# Patient Record
Sex: Female | Born: 1950
Health system: Southern US, Community
[De-identification: ages and names within clinical notes are randomized; demographics above are authoritative.]

## PROBLEM LIST (undated history)

## (undated) DIAGNOSIS — F32A Depression, unspecified: Secondary | ICD-10-CM

## (undated) DIAGNOSIS — F419 Anxiety disorder, unspecified: Secondary | ICD-10-CM

## (undated) DIAGNOSIS — H8103 Meniere's disease, bilateral: Secondary | ICD-10-CM

## (undated) DIAGNOSIS — K222 Esophageal obstruction: Secondary | ICD-10-CM

## (undated) DIAGNOSIS — E538 Deficiency of other specified B group vitamins: Secondary | ICD-10-CM

## (undated) DIAGNOSIS — G4700 Insomnia, unspecified: Secondary | ICD-10-CM

## (undated) DIAGNOSIS — D649 Anemia, unspecified: Secondary | ICD-10-CM

## (undated) DIAGNOSIS — I10 Essential (primary) hypertension: Secondary | ICD-10-CM

## (undated) DIAGNOSIS — K589 Irritable bowel syndrome without diarrhea: Secondary | ICD-10-CM

## (undated) DIAGNOSIS — F329 Major depressive disorder, single episode, unspecified: Secondary | ICD-10-CM

## (undated) DIAGNOSIS — K760 Fatty (change of) liver, not elsewhere classified: Secondary | ICD-10-CM

## (undated) DIAGNOSIS — Z01419 Encounter for gynecological examination (general) (routine) without abnormal findings: Secondary | ICD-10-CM

## (undated) DIAGNOSIS — K219 Gastro-esophageal reflux disease without esophagitis: Secondary | ICD-10-CM

## (undated) DIAGNOSIS — M542 Cervicalgia: Secondary | ICD-10-CM

## (undated) HISTORY — DX: Fatty (change of) liver, not elsewhere classified: K76.0

## (undated) HISTORY — PX: TONSILLECTOMY: SUR1361

## (undated) HISTORY — DX: Irritable bowel syndrome, unspecified: K58.9

## (undated) HISTORY — DX: Encounter for gynecological examination (general) (routine) without abnormal findings: Z01.419

## (undated) HISTORY — DX: Esophageal obstruction: K22.2

## (undated) HISTORY — DX: Meniere's disease, bilateral: H81.03

## (undated) HISTORY — DX: Cervicalgia: M54.2

## (undated) HISTORY — DX: Gastro-esophageal reflux disease without esophagitis: K21.9

## (undated) HISTORY — DX: Anemia, unspecified: D64.9

## (undated) HISTORY — DX: Insomnia, unspecified: G47.00

## (undated) HISTORY — DX: Deficiency of other specified B group vitamins: E53.8

## (undated) HISTORY — DX: Major depressive disorder, single episode, unspecified: F32.9

## (undated) HISTORY — DX: Anxiety disorder, unspecified: F41.9

## (undated) HISTORY — DX: Depression, unspecified: F32.A

## (undated) HISTORY — PX: TUBAL LIGATION: SHX77

## (undated) HISTORY — DX: Essential (primary) hypertension: I10

## (undated) HISTORY — PX: APPENDECTOMY: SHX54

---

## 1998-03-11 ENCOUNTER — Other Ambulatory Visit: Admission: RE | Admit: 1998-03-11 | Discharge: 1998-03-11 | Payer: Self-pay | Admitting: Obstetrics & Gynecology

## 1999-03-31 ENCOUNTER — Other Ambulatory Visit: Admission: RE | Admit: 1999-03-31 | Discharge: 1999-03-31 | Payer: Self-pay | Admitting: Obstetrics & Gynecology

## 2000-04-05 ENCOUNTER — Other Ambulatory Visit: Admission: RE | Admit: 2000-04-05 | Discharge: 2000-04-05 | Payer: Self-pay | Admitting: Obstetrics & Gynecology

## 2000-08-22 ENCOUNTER — Encounter: Admission: RE | Admit: 2000-08-22 | Discharge: 2000-08-22 | Payer: Self-pay | Admitting: Obstetrics & Gynecology

## 2001-10-26 ENCOUNTER — Other Ambulatory Visit: Admission: RE | Admit: 2001-10-26 | Discharge: 2001-10-26 | Payer: Self-pay | Admitting: Obstetrics & Gynecology

## 2002-11-04 ENCOUNTER — Other Ambulatory Visit: Admission: RE | Admit: 2002-11-04 | Discharge: 2002-11-04 | Payer: Self-pay | Admitting: Obstetrics & Gynecology

## 2003-11-10 ENCOUNTER — Encounter: Payer: Self-pay | Admitting: Gastroenterology

## 2003-11-17 ENCOUNTER — Other Ambulatory Visit: Admission: RE | Admit: 2003-11-17 | Discharge: 2003-11-17 | Payer: Self-pay | Admitting: Obstetrics & Gynecology

## 2004-12-21 ENCOUNTER — Other Ambulatory Visit: Admission: RE | Admit: 2004-12-21 | Discharge: 2004-12-21 | Payer: Self-pay | Admitting: Obstetrics & Gynecology

## 2004-12-28 ENCOUNTER — Ambulatory Visit: Payer: Self-pay | Admitting: Family Medicine

## 2005-06-14 ENCOUNTER — Ambulatory Visit: Payer: Self-pay | Admitting: Family Medicine

## 2005-12-26 ENCOUNTER — Other Ambulatory Visit: Admission: RE | Admit: 2005-12-26 | Discharge: 2005-12-26 | Payer: Self-pay | Admitting: Obstetrics & Gynecology

## 2006-01-02 ENCOUNTER — Ambulatory Visit: Payer: Self-pay | Admitting: Family Medicine

## 2006-04-27 ENCOUNTER — Ambulatory Visit: Payer: Self-pay | Admitting: Family Medicine

## 2006-11-06 ENCOUNTER — Ambulatory Visit: Payer: Self-pay | Admitting: Family Medicine

## 2007-01-09 ENCOUNTER — Ambulatory Visit: Payer: Self-pay | Admitting: Family Medicine

## 2007-01-09 LAB — CONVERTED CEMR LAB
ALT: 41 units/L — ABNORMAL HIGH (ref 0–40)
AST: 34 units/L (ref 0–37)
Bilirubin, Direct: 0.1 mg/dL (ref 0.0–0.3)
Cholesterol: 152 mg/dL (ref 0–200)
GFR calc Af Amer: 112 mL/min
HDL: 36.6 mg/dL — ABNORMAL LOW (ref >39.0)
Lymphocytes Relative: 35.2 % (ref 12.0–46.0)
MCHC: 34.8 g/dL (ref 30.0–36.0)
Neutrophils Relative %: 54.4 % (ref 43.0–77.0)
Potassium: 3.9 meq/L (ref 3.5–5.1)
RBC: 4.72 M/uL (ref 3.87–5.11)
TSH: 3.05 microintl units/mL (ref 0.35–5.50)
Total Bilirubin: 0.7 mg/dL (ref 0.3–1.2)
Total CHOL/HDL Ratio: 4.2
Total Protein: 6.7 g/dL (ref 6.0–8.3)
Triglycerides: 167 mg/dL — ABNORMAL HIGH (ref 0–149)
VLDL: 33 mg/dL (ref 0–40)

## 2007-01-17 ENCOUNTER — Ambulatory Visit: Payer: Self-pay | Admitting: Family Medicine

## 2007-06-12 ENCOUNTER — Encounter: Payer: Self-pay | Admitting: Family Medicine

## 2007-06-28 DIAGNOSIS — F411 Generalized anxiety disorder: Secondary | ICD-10-CM | POA: Insufficient documentation

## 2007-06-28 DIAGNOSIS — I10 Essential (primary) hypertension: Secondary | ICD-10-CM

## 2007-12-03 ENCOUNTER — Ambulatory Visit: Payer: Self-pay | Admitting: Family Medicine

## 2007-12-03 DIAGNOSIS — M546 Pain in thoracic spine: Secondary | ICD-10-CM | POA: Insufficient documentation

## 2008-01-16 ENCOUNTER — Telehealth: Payer: Self-pay | Admitting: Family Medicine

## 2008-02-27 ENCOUNTER — Ambulatory Visit: Payer: Self-pay | Admitting: Family Medicine

## 2008-02-27 LAB — CONVERTED CEMR LAB
Bilirubin Urine: NEGATIVE
Blood in Urine, dipstick: NEGATIVE
Nitrite: NEGATIVE
Specific Gravity, Urine: 1.02
Urobilinogen, UA: 0.2
pH: 6

## 2008-03-03 LAB — CONVERTED CEMR LAB
ALT: 27 units/L (ref 0–35)
Basophils Absolute: 0 10*3/uL (ref 0.0–0.1)
Bilirubin, Direct: 0.1 mg/dL (ref 0.0–0.3)
Cholesterol: 171 mg/dL (ref 0–200)
Creatinine, Ser: 0.8 mg/dL (ref 0.4–1.2)
Eosinophils Absolute: 0.2 10*3/uL (ref 0.0–0.7)
Glucose, Bld: 95 mg/dL (ref 70–99)
HCT: 37.7 % (ref 36.0–46.0)
HDL: 32.5 mg/dL — ABNORMAL LOW (ref >39.0)
LDL Cholesterol: 110 mg/dL — ABNORMAL HIGH (ref 0–99)
Lymphocytes Relative: 33.7 % (ref 12.0–46.0)
Monocytes Relative: 7.2 % (ref 3.0–12.0)
Neutrophils Relative %: 56.1 % (ref 43.0–77.0)
Potassium: 4 meq/L (ref 3.5–5.1)
RBC: 4.83 M/uL (ref 3.87–5.11)
RDW: 13.5 % (ref 11.5–14.6)
Sodium: 143 meq/L (ref 135–145)
TSH: 2.04 microintl units/mL (ref 0.35–5.50)
Total CHOL/HDL Ratio: 5.3
Triglycerides: 143 mg/dL (ref 0–149)
VLDL: 29 mg/dL (ref 0–40)
WBC: 6.8 10*3/uL (ref 4.5–10.5)

## 2008-03-05 ENCOUNTER — Ambulatory Visit: Payer: Self-pay | Admitting: Family Medicine

## 2008-03-05 DIAGNOSIS — F418 Other specified anxiety disorders: Secondary | ICD-10-CM

## 2008-04-11 ENCOUNTER — Ambulatory Visit: Payer: Self-pay | Admitting: Family Medicine

## 2008-04-18 ENCOUNTER — Telehealth: Payer: Self-pay | Admitting: Family Medicine

## 2008-06-13 ENCOUNTER — Encounter: Payer: Self-pay | Admitting: Family Medicine

## 2008-12-05 ENCOUNTER — Ambulatory Visit: Payer: Self-pay | Admitting: Family Medicine

## 2008-12-05 DIAGNOSIS — K219 Gastro-esophageal reflux disease without esophagitis: Secondary | ICD-10-CM

## 2008-12-29 ENCOUNTER — Encounter: Payer: Self-pay | Admitting: Gastroenterology

## 2009-01-13 ENCOUNTER — Telehealth: Payer: Self-pay | Admitting: Gastroenterology

## 2009-02-09 ENCOUNTER — Telehealth: Payer: Self-pay | Admitting: Family Medicine

## 2009-03-03 ENCOUNTER — Ambulatory Visit: Payer: Self-pay | Admitting: Family Medicine

## 2009-03-03 LAB — CONVERTED CEMR LAB
Blood in Urine, dipstick: NEGATIVE
Glucose, Urine, Semiquant: NEGATIVE
Ketones, urine, test strip: NEGATIVE
Specific Gravity, Urine: 1.025
Urobilinogen, UA: 0.2

## 2009-03-04 LAB — CONVERTED CEMR LAB
Alkaline Phosphatase: 73 units/L (ref 39–117)
CO2: 31 meq/L (ref 19–32)
Cholesterol: 154 mg/dL (ref 0–200)
Creatinine, Ser: 0.7 mg/dL (ref 0.4–1.2)
Eosinophils Absolute: 0.2 10*3/uL (ref 0.0–0.7)
GFR calc non Af Amer: 91.5 mL/min (ref >60)
Glucose, Bld: 94 mg/dL (ref 70–99)
Hemoglobin: 11.6 g/dL — ABNORMAL LOW (ref 12.0–15.0)
MCHC: 33.8 g/dL (ref 30.0–36.0)
MCV: 74.9 fL — ABNORMAL LOW (ref 78.0–100.0)
Neutro Abs: 3.9 10*3/uL (ref 1.4–7.7)
Platelets: 216 10*3/uL (ref 150.0–400.0)
Potassium: 4 meq/L (ref 3.5–5.1)
RBC: 4.58 M/uL (ref 3.87–5.11)
Sodium: 142 meq/L (ref 135–145)
TSH: 1.72 microintl units/mL (ref 0.35–5.50)
Total CHOL/HDL Ratio: 4
Total Protein: 6.4 g/dL (ref 6.0–8.3)
Triglycerides: 110 mg/dL (ref 0.0–149.0)
VLDL: 22 mg/dL (ref 0.0–40.0)
WBC: 6.4 10*3/uL (ref 4.5–10.5)

## 2009-03-09 ENCOUNTER — Ambulatory Visit: Payer: Self-pay | Admitting: Family Medicine

## 2009-03-19 ENCOUNTER — Ambulatory Visit: Payer: Self-pay | Admitting: Gastroenterology

## 2009-03-19 DIAGNOSIS — K5900 Constipation, unspecified: Secondary | ICD-10-CM

## 2009-03-19 DIAGNOSIS — R209 Unspecified disturbances of skin sensation: Secondary | ICD-10-CM

## 2009-03-19 LAB — CONVERTED CEMR LAB: Tissue Transglutaminase Ab, IgA: 0.1 units (ref <7)

## 2009-03-20 LAB — CONVERTED CEMR LAB
ALT: 18 units/L (ref 0–35)
AST: 26 units/L (ref 0–37)
Alkaline Phosphatase: 72 units/L (ref 39–117)
Calcium: 9.1 mg/dL (ref 8.4–10.5)
Creatinine, Ser: 0.6 mg/dL (ref 0.4–1.2)
Folate: 18.3 ng/mL
GFR calc non Af Amer: 109.3 mL/min (ref >60)
Saturation Ratios: 8 % — ABNORMAL LOW (ref 20.0–50.0)
Sodium: 141 meq/L (ref 135–145)
TSH: 1.69 microintl units/mL (ref 0.35–5.50)
Total Protein: 6.7 g/dL (ref 6.0–8.3)
Vitamin B-12: 726 pg/mL (ref 211–911)

## 2009-03-30 ENCOUNTER — Ambulatory Visit (HOSPITAL_COMMUNITY): Admission: RE | Admit: 2009-03-30 | Discharge: 2009-03-30 | Payer: Self-pay | Admitting: Gastroenterology

## 2009-04-02 ENCOUNTER — Telehealth: Payer: Self-pay | Admitting: Gastroenterology

## 2009-04-29 ENCOUNTER — Ambulatory Visit: Payer: Self-pay | Admitting: Gastroenterology

## 2009-04-29 ENCOUNTER — Encounter: Payer: Self-pay | Admitting: Gastroenterology

## 2009-04-29 HISTORY — PX: ESOPHAGOGASTRODUODENOSCOPY: SHX1529

## 2009-04-29 HISTORY — PX: COLONOSCOPY: SHX174

## 2009-04-29 LAB — CONVERTED CEMR LAB: UREASE: NEGATIVE

## 2009-04-30 ENCOUNTER — Ambulatory Visit: Payer: Self-pay | Admitting: Gastroenterology

## 2009-05-01 ENCOUNTER — Encounter: Payer: Self-pay | Admitting: Gastroenterology

## 2009-05-19 DIAGNOSIS — K7689 Other specified diseases of liver: Secondary | ICD-10-CM

## 2009-05-19 DIAGNOSIS — K589 Irritable bowel syndrome without diarrhea: Secondary | ICD-10-CM

## 2009-05-19 DIAGNOSIS — D509 Iron deficiency anemia, unspecified: Secondary | ICD-10-CM | POA: Insufficient documentation

## 2009-05-19 DIAGNOSIS — K222 Esophageal obstruction: Secondary | ICD-10-CM

## 2009-05-28 ENCOUNTER — Ambulatory Visit: Payer: Self-pay | Admitting: Gastroenterology

## 2009-06-17 ENCOUNTER — Encounter: Payer: Self-pay | Admitting: Family Medicine

## 2009-08-19 ENCOUNTER — Ambulatory Visit: Payer: Self-pay | Admitting: Family Medicine

## 2009-08-19 DIAGNOSIS — R42 Dizziness and giddiness: Secondary | ICD-10-CM | POA: Insufficient documentation

## 2009-08-19 DIAGNOSIS — H9319 Tinnitus, unspecified ear: Secondary | ICD-10-CM | POA: Insufficient documentation

## 2009-08-21 ENCOUNTER — Telehealth: Payer: Self-pay | Admitting: Gastroenterology

## 2009-08-21 ENCOUNTER — Encounter: Admission: RE | Admit: 2009-08-21 | Discharge: 2009-08-21 | Payer: Self-pay | Admitting: Family Medicine

## 2009-08-31 ENCOUNTER — Telehealth: Payer: Self-pay | Admitting: Family Medicine

## 2009-12-03 ENCOUNTER — Ambulatory Visit: Payer: Self-pay | Admitting: Family Medicine

## 2010-05-17 ENCOUNTER — Telehealth: Payer: Self-pay | Admitting: Family Medicine

## 2010-05-19 ENCOUNTER — Ambulatory Visit: Payer: Self-pay | Admitting: Family Medicine

## 2010-05-24 LAB — CONVERTED CEMR LAB
Albumin: 4.1 g/dL (ref 3.5–5.2)
Alkaline Phosphatase: 74 units/L (ref 39–117)
BUN: 20 mg/dL (ref 6–23)
Basophils Absolute: 0 10*3/uL (ref 0.0–0.1)
Bilirubin Urine: NEGATIVE
Calcium: 9.2 mg/dL (ref 8.4–10.5)
Cholesterol: 192 mg/dL (ref 0–200)
Eosinophils Absolute: 0.2 10*3/uL (ref 0.0–0.7)
Eosinophils Relative: 2 % (ref 0.0–5.0)
GFR calc non Af Amer: 92.64 mL/min (ref >60)
Hemoglobin, Urine: NEGATIVE
LDL Cholesterol: 120 mg/dL — ABNORMAL HIGH (ref 0–99)
MCHC: 34.8 g/dL (ref 30.0–36.0)
Monocytes Absolute: 0.5 10*3/uL (ref 0.1–1.0)
Neutrophils Relative %: 58 % (ref 43.0–77.0)
Platelets: 217 10*3/uL (ref 150.0–400.0)
RBC: 4.76 M/uL (ref 3.87–5.11)
Sodium: 134 meq/L — ABNORMAL LOW (ref 135–145)
Specific Gravity, Urine: 1.025 (ref 1.000–1.030)
Total Bilirubin: 0.8 mg/dL (ref 0.3–1.2)
Total Protein, Urine: NEGATIVE mg/dL
Total Protein: 7.1 g/dL (ref 6.0–8.3)
Triglycerides: 136 mg/dL (ref 0.0–149.0)
VLDL: 27.2 mg/dL (ref 0.0–40.0)
WBC: 7.8 10*3/uL (ref 4.5–10.5)
pH: 5.5 (ref 5.0–8.0)

## 2010-05-26 ENCOUNTER — Ambulatory Visit: Payer: Self-pay | Admitting: Family Medicine

## 2010-06-22 ENCOUNTER — Encounter: Payer: Self-pay | Admitting: Family Medicine

## 2010-11-23 NOTE — Assessment & Plan Note (Signed)
Summary: CPX // RS/pt rescd//ccm   Vital Signs:  Patient profile:   60 year old female Menstrual status:  postmenopausal Weight:      196 pounds BMI:     29.69 BP sitting:   112 / 70  (left arm) Cuff size:   regular  Vitals Entered By: Raechel Ache, RN (May 26, 2010 9:09 AM) CC: CPX, labs done. Sees gyn.   History of Present Illness: 60 yr old female for a cpx. She feels fine and has no concerns. when we spoke last winter she was [place] lot of stress, and she tried Celexa. She took this for 2 weeks and then stopped. Now her work situation is much less stressful, and she doesn't feel she needs to take anything for this.   Allergies: 1)  ! Pcn  Past History:  Past Medical History: Reviewed history from 03/05/2008 and no changes required. Anxiety Hypertension sees Dr. Arlyce Dice for gyn exams insomnia neck pain Depression  Past Surgical History: Reviewed history from 03/09/2009 and no changes required. Appendectomy Tubal ligation Tonsillectomy Colonoscopy 11-10-03 per Dr. Jarold Motto, repeat in 10  yrs  Family History: Reviewed history from 05/28/2009 and no changes required. Family History of CAD Female 1st degree relative <50-Mother Family History Diabetes 1st degree relative- Mother & Siblings Family History Hypertension Family History of Irritable Bowel Syndrome:Mother No FH of Colon Cancer:  Social History: Reviewed history from 03/19/2009 and no changes required. Occupation:Adjuster Married Never Smoked Alcohol use-yes -2 weekly Daily Caffeine Use-1  Review of Systems  The patient denies anorexia, fever, weight loss, weight gain, vision loss, decreased hearing, hoarseness, chest pain, syncope, dyspnea on exertion, peripheral edema, prolonged cough, headaches, hemoptysis, abdominal pain, melena, hematochezia, severe indigestion/heartburn, hematuria, incontinence, genital sores, muscle weakness, suspicious skin lesions, transient blindness, difficulty walking,  depression, unusual weight change, abnormal bleeding, enlarged lymph nodes, angioedema, breast masses, and testicular masses.    Physical Exam  General:  overweight-appearing.   Head:  Normocephalic and atraumatic without obvious abnormalities. No apparent alopecia or balding. Eyes:  No corneal or conjunctival inflammation noted. EOMI. Perrla. Funduscopic exam benign, without hemorrhages, exudates or papilledema. Vision grossly normal. Ears:  External ear exam shows no significant lesions or deformities.  Otoscopic examination reveals clear canals, tympanic membranes are intact bilaterally without bulging, retraction, inflammation or discharge. Hearing is grossly normal bilaterally. Nose:  External nasal examination shows no deformity or inflammation. Nasal mucosa are pink and moist without lesions or exudates. Mouth:  Oral mucosa and oropharynx without lesions or exudates.  Teeth in good repair. Neck:  No deformities, masses, or tenderness noted. Lungs:  Normal respiratory effort, chest expands symmetrically. Lungs are clear to auscultation, no crackles or wheezes. Heart:  Normal rate and regular rhythm. S1 and S2 normal without gallop, murmur, click, rub or other extra sounds. EKG normal Abdomen:  Bowel sounds positive,abdomen soft and non-tender without masses, organomegaly or hernias noted. Msk:  No deformity or scoliosis noted of thoracic or lumbar spine.   Pulses:  R and L carotid,radial,femoral,dorsalis pedis and posterior tibial pulses are full and equal bilaterally Extremities:  No clubbing, cyanosis, edema, or deformity noted with normal full range of motion of all joints.   Neurologic:  No cranial nerve deficits noted. Station and gait are normal. Plantar reflexes are down-going bilaterally. DTRs are symmetrical throughout. Sensory, motor and coordinative functions appear intact. Skin:  Intact without suspicious lesions or rashes Cervical Nodes:  No lymphadenopathy noted Axillary  Nodes:  No palpable lymphadenopathy Inguinal Nodes:  No significant adenopathy Psych:  Cognition and judgment appear intact. Alert and cooperative with normal attention span and concentration. No apparent delusions, illusions, hallucinations   Impression & Recommendations:  Problem # 1:  WELL ADULT EXAM (ICD-V70.0)  Orders: EKG w/ Interpretation (93000)  Complete Medication List: 1)  Ambien 10 Mg Tabs (Zolpidem tartrate) .... At bedtime as needed 2)  Benicar Hct 20-12.5 Mg Tabs (Olmesartan medoxomil-hctz) .Marland Kitchen.. 1 by mouth once daily 3)  Alprazolam 0.5 Mg Tb24 (Alprazolam) .... Three times a day as needed 4)  Aspirin 81 Mg Tbec (Aspirin) .... One by mouth every day 5)  Fish Oil Oil (Fish oil) .Marland Kitchen.. 1 by mouth once daily 6)  Calcium Carbonate-vitamin D 600-400 Mg-unit Tabs (Calcium carbonate-vitamin d) .Marland Kitchen.. 1 by mouth once daily 7)  Vitamin E 600 Unit Caps (Vitamin e) .Marland Kitchen.. 1 by mouth once daily 8)  Meclizine Hcl 25 Mg Tabs (Meclizine hcl) .Marland Kitchen.. 1 q 4 hours as needed dizziness 9)  Omeprazole 40 Mg Cpdr (Omeprazole) .... Once daily  Patient Instructions: 1)  Please schedule a follow-up appointment in 1 year.  2)  It is important that you exercise reguarly at least 20 minutes 5 times a week. If you develop chest pain, have severe difficulty breathing, or feel very tired, stop exercising immediately and seek medical attention.  3)  You need to lose weight. Consider a lower calorie diet and regular exercise.  Prescriptions: OMEPRAZOLE 40 MG CPDR (OMEPRAZOLE) once daily  #90 x 3   Entered and Authorized by:   Nelwyn Salisbury MD   Signed by:   Nelwyn Salisbury MD on 05/26/2010   Method used:   Print then Give to Patient   RxID:   [id number]

## 2010-11-23 NOTE — Assessment & Plan Note (Signed)
Summary: MEDICATION CONCERNS // RS   Vital Signs:  Patient profile:   60 year old female Menstrual status:  postmenopausal Weight:      188 pounds O2 Sat:      98 % Temp:     98.2 degrees F oral Pulse rate:   105 / minute Pulse rhythm:   regular Resp:     16 per minute BP sitting:   122 / 82  Vitals Entered By: Lynann Beaver CMA (December 03, 2009 10:01 AM) CC: to discuss meds and has had body aches, fever, chills and headache x 2-3 days Pain Assessment Patient in pain? no        History of Present Illness: Here for 2 reasons. First she has been dealing with a lot of stress lately, and is taking more Xanax than she would like to. She wants to start a  daily med for this. Also 3 days ago she developed fevers, body aches, ST, and a dry cough. No NVD. On fluids and Motrin.   Current Medications (verified): 1)  Ambien 10 Mg Tabs (Zolpidem Tartrate) .... At Bedtime As Needed 2)  Benicar Hct 20-12.5 Mg  Tabs (Olmesartan Medoxomil-Hctz) .Marland Kitchen.. 1 By Mouth Once Daily 3)  Alprazolam 0.5 Mg  Tb24 (Alprazolam) .... Three Times A Day As Needed 4)  Aspirin 81 Mg  Tbec (Aspirin) .... One By Mouth Every Day 5)  Fish Oil   Oil (Fish Oil) .Marland Kitchen.. 1 By Mouth Once Daily 6)  Calcium Carbonate-Vitamin D 600-400 Mg-Unit  Tabs (Calcium Carbonate-Vitamin D) .Marland Kitchen.. 1 By Mouth Once Daily 7)  Vitamin E 600 Unit  Caps (Vitamin E) .Marland Kitchen.. 1 By Mouth Once Daily 8)  Nexium 40 Mg  Cpdr (Esomeprazole Magnesium) .Marland Kitchen.. 1 Capsule Each Day 30 Minutes Before Meal 9)  Meclizine Hcl 25 Mg Tabs (Meclizine Hcl) .Marland Kitchen.. 1 Q 4 Hours As Needed Dizziness  Allergies (verified): 1)  ! Pcn  Past History:  Past Medical History: Reviewed history from 03/05/2008 and no changes required. Anxiety Hypertension sees Dr. Arlyce Dice for gyn exams insomnia neck pain Depression  Review of Systems  The patient denies anorexia, weight loss, weight gain, vision loss, decreased hearing, hoarseness, chest pain, syncope, dyspnea on exertion,  peripheral edema, hemoptysis, abdominal pain, melena, hematochezia, severe indigestion/heartburn, hematuria, incontinence, genital sores, muscle weakness, suspicious skin lesions, transient blindness, difficulty walking, depression, unusual weight change, abnormal bleeding, enlarged lymph nodes, angioedema, breast masses, and testicular masses.    Physical Exam  General:  Well-developed,well-nourished,in no acute distress; alert,appropriate and cooperative throughout examination Head:  Normocephalic and atraumatic without obvious abnormalities. No apparent alopecia or balding. Eyes:  No corneal or conjunctival inflammation noted. EOMI. Perrla. Funduscopic exam benign, without hemorrhages, exudates or papilledema. Vision grossly normal. Ears:  External ear exam shows no significant lesions or deformities.  Otoscopic examination reveals clear canals, tympanic membranes are intact bilaterally without bulging, retraction, inflammation or discharge. Hearing is grossly normal bilaterally. Nose:  External nasal examination shows no deformity or inflammation. Nasal mucosa are pink and moist without lesions or exudates. Mouth:  Oral mucosa and oropharynx without lesions or exudates.  Teeth in good repair. Neck:  No deformities, masses, or tenderness noted. Lungs:  Normal respiratory effort, chest expands symmetrically. Lungs are clear to auscultation, no crackles or wheezes. Psych:  Cognition and judgment appear intact. Alert and cooperative with normal attention span and concentration. No apparent delusions, illusions, hallucinations   Impression & Recommendations:  Problem # 1:  INFLUENZA (ICD-487.8)  Problem # 2:  ANXIETY (ICD-300.00)  Her updated medication list for this problem includes:    Alprazolam 0.5 Mg Tb24 (Alprazolam) .Marland Kitchen... Three times a day as needed    Celexa 20 Mg Tabs (Citalopram hydrobromide) ..... Once daily  Complete Medication List: 1)  Ambien 10 Mg Tabs (Zolpidem tartrate) ....  At bedtime as needed 2)  Benicar Hct 20-12.5 Mg Tabs (Olmesartan medoxomil-hctz) .Marland Kitchen.. 1 by mouth once daily 3)  Alprazolam 0.5 Mg Tb24 (Alprazolam) .... Three times a day as needed 4)  Aspirin 81 Mg Tbec (Aspirin) .... One by mouth every day 5)  Fish Oil Oil (Fish oil) .Marland Kitchen.. 1 by mouth once daily 6)  Calcium Carbonate-vitamin D 600-400 Mg-unit Tabs (Calcium carbonate-vitamin d) .Marland Kitchen.. 1 by mouth once daily 7)  Vitamin E 600 Unit Caps (Vitamin e) .Marland Kitchen.. 1 by mouth once daily 8)  Nexium 40 Mg Cpdr (Esomeprazole magnesium) .Marland Kitchen.. 1 capsule each day 30 minutes before meal 9)  Meclizine Hcl 25 Mg Tabs (Meclizine hcl) .Marland Kitchen.. 1 q 4 hours as needed dizziness 10)  Celexa 20 Mg Tabs (Citalopram hydrobromide) .... Once daily  Patient Instructions: 1)  Please schedule a follow-up appointment as needed . Out of work todat until 12-07-09 Prescriptions: CELEXA 20 MG TABS (CITALOPRAM HYDROBROMIDE) once daily  #30 x 11   Entered and Authorized by:   Nelwyn Salisbury MD   Signed by:   Nelwyn Salisbury MD on 12/03/2009   Method used:   [facility]        [facility] [phone]* (retail)       [address]       [place], [place]  [address]       Ph: [phone]       Fax: [phone]   RxID:   [id number]

## 2010-11-23 NOTE — Progress Notes (Signed)
Summary: refill ambien and alprazolam  Phone Note Refill Request Message from:  Fax from Pharmacy on May 17, 2010 3:02 PM  Refills Requested: Medication #1:  ALPRAZOLAM 0.5 MG  TB24 three times a day as needed   Supply Requested: 3 months  Medication #2:  AMBIEN 10 MG TABS at bedtime as needed   Supply Requested: 3 months  Method Requested: Fax to Local Pharmacy Initial call taken by: Raechel Ache, RN,  May 17, 2010 3:03 PM Caller: [facility]     Appended Document: refill ambien and alprazolam

## 2010-11-25 ENCOUNTER — Other Ambulatory Visit: Payer: Self-pay

## 2010-11-25 DIAGNOSIS — F419 Anxiety disorder, unspecified: Secondary | ICD-10-CM

## 2010-11-26 MED ORDER — ALPRAZOLAM 0.5 MG PO TABS: 0.5000 mg | ORAL_TABLET | Freq: Three times a day (TID) | ORAL | Status: DC | PRN

## 2010-11-26 NOTE — Telephone Encounter (Signed)
Faxed to [facility]

## 2011-03-11 NOTE — Assessment & Plan Note (Signed)
[facility] OFFICE NOTE   NAME:Pamela Daugherty, Pamela Daugherty                        MRN:          [id number]  DATE:01/17/2007                            DOB:          May 23, 1951    This is a 60 year old woman here for a nongynecological physical  examination. In general she is doing well except for an upper  respiratory infection. For the past 3 days she has had chills, low grade  fever, stuffy head, postnasal drainage, wheezing, and a nonproductive  cough. She is drinking fluids and taking Robitussin. Otherwise she is  doing well. She continues to see Dr. Arlyce Dice for gynecology exams. She  had a normal colonoscopy in 2005. Her anxiety has been under good  control. She is sleeping well and her blood pressure has been stable.  For other details of her past medical history, family history, social  history, habits, etc, I refer you to our last physical note date January 02, 2006.   ALLERGIES:  PENICILLIN.   CURRENT MEDICATIONS:  1. Benicar/hydrochlorothiazide 20/12.5 once a day.  2. Multivitamins daily.  3. Glucosamine and chondroitin sulfate daily.  4. Aspirin 81 mg daily.  5. Ambien 10 mg at bedtime as needed (she averages 2 or 3 a month).  6. Xanax 0.5 mg as needed (she averages 2 or 3 a month).   OBJECTIVE:  Height 5 feet 9 inches, weight 206, blood pressure 132/88,  pulse 76 and regular.  IN GENERAL: She appears to be at her baseline. She is obese.  SKIN: Free of significant lesions.  EYES: Clear.  EARS: Clear.  PHARYNX: Clear.  NECK: Supple without lymphadenopathy or masses.  LUNGS: Clear.  CARDIAC: Rate and rhythm regular, without gallops, murmurs, or rubs.  Distal pulses are full.   EKG: Within normal limits.  ABDOMEN: Soft, normal bowel sounds, nontender. No masses.  EXTREMITIES: No cyanosis, clubbing, or edema.  NEUROLOGIC EXAM: Grossly intact.   She was here for fasting labs on March 18, these were all within  normal  limits.   ASSESSMENT/PLAN:  1. Complete physical exam. We talked about increasing exercise and      losing weight.  2. Hypertension, stable.  3. Anxiety, stable.  4. Insomnia, stable.  5. Bronchitis, I gave her a Z-Pak.     Tera Mater. Clent Ridges, MD  Electronically Signed    SAF/MedQ  DD: 01/17/2007  DT: 01/17/2007  Job #: [id number]

## 2011-03-24 ENCOUNTER — Other Ambulatory Visit: Payer: Self-pay | Admitting: Gastroenterology

## 2011-05-20 ENCOUNTER — Other Ambulatory Visit (INDEPENDENT_AMBULATORY_CARE_PROVIDER_SITE_OTHER)

## 2011-05-20 ENCOUNTER — Telehealth: Payer: Self-pay | Admitting: Family Medicine

## 2011-05-20 DIAGNOSIS — Z0000 Encounter for general adult medical examination without abnormal findings: Secondary | ICD-10-CM

## 2011-05-20 LAB — POCT URINALYSIS DIPSTICK
Glucose, UA: NEGATIVE
Spec Grav, UA: 1.025
Urobilinogen, UA: 0.2
pH, UA: 5.5

## 2011-05-20 LAB — LIPID PANEL
HDL: 47.1 mg/dL (ref >39.00)
LDL Cholesterol: 86 mg/dL (ref 0–99)
Total CHOL/HDL Ratio: 3
VLDL: 24 mg/dL (ref 0.0–40.0)

## 2011-05-20 LAB — CBC WITH DIFFERENTIAL/PLATELET
Basophils Relative: 0.4 % (ref 0.0–3.0)
Eosinophils Relative: 1.6 % (ref 0.0–5.0)
Hemoglobin: 13.7 g/dL (ref 12.0–15.0)
Lymphocytes Relative: 26.2 % (ref 12.0–46.0)
Monocytes Absolute: 0.5 10*3/uL (ref 0.1–1.0)
Monocytes Relative: 5.9 % (ref 3.0–12.0)
Neutrophils Relative %: 65.9 % (ref 43.0–77.0)
Platelets: 216 10*3/uL (ref 150.0–400.0)

## 2011-05-20 LAB — HEPATIC FUNCTION PANEL
Alkaline Phosphatase: 63 U/L (ref 39–117)
Total Protein: 7.2 g/dL (ref 6.0–8.3)

## 2011-05-20 LAB — TSH: TSH: 1.69 u[IU]/mL (ref 0.35–5.50)

## 2011-05-20 LAB — BASIC METABOLIC PANEL: Sodium: 141 mEq/L (ref 135–145)

## 2011-05-20 NOTE — Telephone Encounter (Signed)
Call in Cipro 500 mg bid for 7 days  

## 2011-05-20 NOTE — Telephone Encounter (Signed)
Script called in and message left for pt.

## 2011-05-20 NOTE — Telephone Encounter (Signed)
Pt was dx'd with an uti this morning and would like for Dr Clent Ridges to [facility] in [place]. Patient refused to come in. Thanks.

## 2011-05-26 ENCOUNTER — Encounter: Payer: Self-pay | Admitting: Family Medicine

## 2011-05-30 ENCOUNTER — Encounter: Payer: Self-pay | Admitting: Family Medicine

## 2011-05-30 ENCOUNTER — Ambulatory Visit (INDEPENDENT_AMBULATORY_CARE_PROVIDER_SITE_OTHER): Admitting: Family Medicine

## 2011-05-30 VITALS — BP 122/78 | HR 87 | Temp 98.3°F | Ht 67.5 in | Wt 183.0 lb

## 2011-05-30 DIAGNOSIS — F419 Anxiety disorder, unspecified: Secondary | ICD-10-CM

## 2011-05-30 DIAGNOSIS — Z0000 Encounter for general adult medical examination without abnormal findings: Secondary | ICD-10-CM

## 2011-05-30 DIAGNOSIS — F411 Generalized anxiety disorder: Secondary | ICD-10-CM

## 2011-05-30 MED ORDER — ZOLPIDEM TARTRATE 10 MG PO TABS: 10.0000 mg | ORAL_TABLET | Freq: Every evening | ORAL | Status: DC | PRN

## 2011-05-30 MED ORDER — OLMESARTAN MEDOXOMIL-HCTZ 20-12.5 MG PO TABS: 1.0000 | ORAL_TABLET | Freq: Every day | ORAL | Status: DC

## 2011-05-30 MED ORDER — ALPRAZOLAM 0.5 MG PO TABS: 0.5000 mg | ORAL_TABLET | Freq: Three times a day (TID) | ORAL | Status: AC | PRN

## 2011-05-30 NOTE — Progress Notes (Signed)
  Subjective:    Patient ID: Pamela Daugherty, female    DOB: April 26, 1951, 60 y.o.   MRN: [id number]  HPI 60 yr old female for a cpx. She feels fine with the exception of stress. Her job has changed again, and she is now faced with a choice of either moving to [place], [place] at the end of this year or being laid off. She is using Xanax several times a day, and is using Ambien at night.    Review of Systems  Constitutional: Negative.   HENT: Negative.   Eyes: Negative.   Respiratory: Negative.   Cardiovascular: Negative.   Gastrointestinal: Negative.   Genitourinary: Negative for dysuria, urgency, frequency, hematuria, flank pain, decreased urine volume, enuresis, difficulty urinating, pelvic pain and dyspareunia.  Musculoskeletal: Negative.   Skin: Negative.   Neurological: Negative.   Hematological: Negative.   Psychiatric/Behavioral: Negative.        Objective:   Physical Exam  Constitutional: She is oriented to person, place, and time. She appears well-developed and well-nourished. No distress.  HENT:  Head: Normocephalic and atraumatic.  Right Ear: External ear normal.  Left Ear: External ear normal.  Nose: Nose normal.  Mouth/Throat: Oropharynx is clear and moist. No oropharyngeal exudate.  Eyes: Conjunctivae and EOM are normal. Pupils are equal, round, and reactive to light. No scleral icterus.  Neck: Normal range of motion. Neck supple. No JVD present. No thyromegaly present.  Cardiovascular: Normal rate, regular rhythm, normal heart sounds and intact distal pulses.  Exam reveals no gallop and no friction rub.   No murmur heard.      EKG normal   Pulmonary/Chest: Effort normal and breath sounds normal. No respiratory distress. She has no wheezes. She has no rales. She exhibits no tenderness.  Abdominal: Soft. Bowel sounds are normal. She exhibits no distension and no mass. There is no tenderness. There is no rebound and no guarding.  Musculoskeletal: Normal range of motion.  She exhibits no edema and no tenderness.  Lymphadenopathy:    She has no cervical adenopathy.  Neurological: She is alert and oriented to person, place, and time. She has normal reflexes. No cranial nerve deficit. She exhibits normal muscle tone. Coordination normal.  Skin: Skin is warm and dry. No rash noted. No erythema.  Psychiatric: She has a normal mood and affect. Her behavior is normal. Judgment and thought content normal.          Assessment & Plan:  She will try Celexa 20 mg a day again and will call me back in 2 weeks.

## 2011-06-23 ENCOUNTER — Telehealth: Payer: Self-pay | Admitting: *Deleted

## 2011-06-23 NOTE — Telephone Encounter (Signed)
Celexa is not working and wants Lexapro.  [facility].

## 2011-06-24 NOTE — Telephone Encounter (Signed)
Call in Lexapro 20 mg a day, #30 with 2 rf . See me in one month

## 2011-06-28 MED ORDER — ESCITALOPRAM OXALATE 20 MG PO TABS: 20.0000 mg | ORAL_TABLET | Freq: Every day | ORAL | Status: DC

## 2011-06-28 NOTE — Telephone Encounter (Signed)
Left message for patient and rx sent

## 2011-07-07 ENCOUNTER — Encounter: Payer: Self-pay | Admitting: Family Medicine

## 2011-07-08 ENCOUNTER — Ambulatory Visit: Admitting: Family Medicine

## 2011-07-28 ENCOUNTER — Telehealth: Payer: Self-pay | Admitting: Family Medicine

## 2011-07-28 NOTE — Telephone Encounter (Signed)
Pt called and requested a referral for ENT. She has tried to make this appointment but they will not see without the referral.

## 2011-07-29 NOTE — Telephone Encounter (Signed)
What is this referral for?  

## 2011-08-02 ENCOUNTER — Telehealth: Payer: Self-pay | Admitting: Family Medicine

## 2011-08-02 DIAGNOSIS — H9209 Otalgia, unspecified ear: Secondary | ICD-10-CM

## 2011-08-02 DIAGNOSIS — R42 Dizziness and giddiness: Secondary | ICD-10-CM

## 2011-08-02 NOTE — Telephone Encounter (Signed)
Pt has right ear pain, head feels stopped up and she is dizzy. This is the reason that she wants the ENT referral.

## 2011-08-02 NOTE — Telephone Encounter (Signed)
Referral is done. Camelia Eng will call her

## 2011-08-03 ENCOUNTER — Telehealth: Payer: Self-pay | Admitting: Family Medicine

## 2011-08-03 NOTE — Telephone Encounter (Signed)
Pt aware.

## 2011-10-05 ENCOUNTER — Ambulatory Visit (INDEPENDENT_AMBULATORY_CARE_PROVIDER_SITE_OTHER): Payer: Commercial Managed Care - PPO | Admitting: Family Medicine

## 2011-10-05 ENCOUNTER — Encounter: Payer: Self-pay | Admitting: Family Medicine

## 2011-10-05 VITALS — BP 132/84 | HR 90 | Temp 98.0°F | Wt 178.0 lb

## 2011-10-05 DIAGNOSIS — N390 Urinary tract infection, site not specified: Secondary | ICD-10-CM

## 2011-10-05 DIAGNOSIS — L723 Sebaceous cyst: Secondary | ICD-10-CM

## 2011-10-05 LAB — POCT URINALYSIS DIPSTICK
Spec Grav, UA: 1.025
Urobilinogen, UA: 0.2
pH, UA: 6

## 2011-10-05 MED ORDER — ESCITALOPRAM OXALATE 20 MG PO TABS: 20.0000 mg | ORAL_TABLET | Freq: Every day | ORAL | Status: DC

## 2011-10-05 MED ORDER — CIPROFLOXACIN HCL 500 MG PO TABS: 500.0000 mg | ORAL_TABLET | Freq: Two times a day (BID) | ORAL | Status: AC

## 2011-10-05 NOTE — Progress Notes (Signed)
Addended by: Aniceto Boss A on: 10/05/2011 01:06 PM   Modules accepted: Orders

## 2011-10-05 NOTE — Progress Notes (Signed)
  Subjective:    Patient ID: Pamela Daugherty, female    DOB: 1951-06-28, 60 y.o.   MRN: [id number]  HPI Here for 2 weeks of burning on urination and urgency. No fever or nausea.   Review of Systems  Constitutional: Negative.   Gastrointestinal: Negative.   Genitourinary: Positive for dysuria and urgency.       Objective:   Physical Exam  Constitutional: She appears well-developed and well-nourished.  Abdominal: Soft. Bowel sounds are normal. She exhibits no distension and no mass. There is no tenderness. There is no rebound and no guarding.  Skin:       nontender cyst on the right parietal scalp           Assessment & Plan:  Drink plenty of water

## 2011-10-06 ENCOUNTER — Telehealth: Payer: Self-pay | Admitting: Family Medicine

## 2011-10-06 NOTE — Telephone Encounter (Signed)
I left voice message for pt to return my call. I received a refill request for Escitalopram. Does pt want a 30 day supply or 90 day supply and where do we send the script?

## 2011-10-06 NOTE — Telephone Encounter (Signed)
Pt stated that she like  #30 x 11 [facility]. Thanks.

## 2011-10-06 NOTE — Telephone Encounter (Signed)
This has been done.

## 2011-10-24 ENCOUNTER — Telehealth: Payer: Self-pay

## 2011-10-24 MED ORDER — ESCITALOPRAM OXALATE 20 MG PO TABS: 20.0000 mg | ORAL_TABLET | Freq: Every day | ORAL | Status: DC

## 2011-10-24 NOTE — Telephone Encounter (Signed)
Per pharmacy, pt's insurance requires that lexapro be filled for #90 and not #30.  Rx changed to #90 with 2 refills.

## 2011-11-28 ENCOUNTER — Other Ambulatory Visit: Payer: Self-pay | Admitting: Obstetrics & Gynecology

## 2011-12-01 ENCOUNTER — Encounter: Payer: Self-pay | Admitting: *Deleted

## 2011-12-08 ENCOUNTER — Encounter: Payer: Self-pay | Admitting: Gastroenterology

## 2011-12-08 ENCOUNTER — Ambulatory Visit (INDEPENDENT_AMBULATORY_CARE_PROVIDER_SITE_OTHER): Payer: Commercial Managed Care - PPO | Admitting: Gastroenterology

## 2011-12-08 DIAGNOSIS — R112 Nausea with vomiting, unspecified: Secondary | ICD-10-CM

## 2011-12-08 DIAGNOSIS — K3184 Gastroparesis: Secondary | ICD-10-CM

## 2011-12-08 DIAGNOSIS — R634 Abnormal weight loss: Secondary | ICD-10-CM

## 2011-12-08 DIAGNOSIS — K5901 Slow transit constipation: Secondary | ICD-10-CM

## 2011-12-08 MED ORDER — LINACLOTIDE 145 MCG PO CAPS: 1.0000 | ORAL_CAPSULE | Freq: Every day | ORAL | Status: DC

## 2011-12-08 MED ORDER — ESOMEPRAZOLE MAGNESIUM 40 MG PO CPDR: 40.0000 mg | DELAYED_RELEASE_CAPSULE | Freq: Every day | ORAL | Status: DC

## 2011-12-08 NOTE — Patient Instructions (Addendum)
Take Nexium once a day, rx has been sent to your pharmacy. Take Linzess once a day on a empty stomach, 30 days of samples given today.  Your Gastric Empty Scan has been scheduled at [facility] 12/19/2011 1pm, please arrive at 12:45pm and stop your Nexium 12/18/2011.  <ake a follow up appt to see Dr Florentina Jenny in one month.

## 2011-12-08 NOTE — Progress Notes (Signed)
This is a 61 year old Caucasian female with early satiety and acid reflux symptoms associated with chronic functional constipation. She denies dysphagia, melena, hematochezia, or any specific hepatobiliary complaints. She's had a previous negative endoscopy, colonoscopy, an ultrasound exam. She lately has had a 20 pound weight loss over the last year. She does not abuse alcohol, cigarettes, or NSAIDs. Review of her labs and x-rays show no specific abnormalities except for mild fatty liver on ultrasound exam. Family history is noncontributory.  Current Medications, Allergies, Past Medical History, Past Surgical History, Family History and Social History were reviewed in [employer] record.  Pertinent Review of Systems Negative   Physical Exam: Cannot appreciate hepatosplenomegaly, abdominal masses or tenderness. Bowel sounds are normal. There is no succussion splash noted. Mental status is normal.    Assessment and Plan: Probable gastroparesis with secondary acid reflux and constipation. I have scheduled technetium gastric emptying scan, started Nexium 40 mg a day for GERD, and also Linzess 145 mg a day for her constipation. She is to return in one month's time for follow up. If she has evidence of a motility disorder we will consider prokinetic therapy.

## 2011-12-13 ENCOUNTER — Encounter (HOSPITAL_COMMUNITY)
Admission: RE | Admit: 2011-12-13 | Discharge: 2011-12-13 | Disposition: A | Discharge status: Home / Self Care | Payer: Commercial Managed Care - PPO | Source: Ambulatory Visit | Attending: Gastroenterology | Admitting: Gastroenterology

## 2011-12-13 DIAGNOSIS — R112 Nausea with vomiting, unspecified: Secondary | ICD-10-CM | POA: Insufficient documentation

## 2011-12-13 MED ORDER — TECHNETIUM TC 99M SULFUR COLLOID
2.0000 | Freq: Once | INTRAVENOUS | Status: AC | PRN
  Administered 2011-12-13: 2 via INTRAVENOUS

## 2011-12-14 ENCOUNTER — Other Ambulatory Visit: Payer: Self-pay | Admitting: Gastroenterology

## 2011-12-14 MED ORDER — AMBULATORY NON FORMULARY MEDICATION: Status: DC

## 2012-01-16 ENCOUNTER — Other Ambulatory Visit (HOSPITAL_COMMUNITY): Payer: Commercial Managed Care - PPO

## 2012-05-31 ENCOUNTER — Telehealth: Payer: Self-pay | Admitting: Gastroenterology

## 2012-05-31 NOTE — Telephone Encounter (Signed)
Last OV 12/08/11; hx of N/V, Gastroparesis, Slow Transit Constipation had abnormal GES, started on Domperidone and Linzess. Today pt reports last night she ate and had terrible pain under her l rib cage and her lower abdomen; she is still hurting today. After discussing her diet and Domperidone use, she admits she is not following the diet and takes the Domperidone sporadically d/t work; always takes the am dose  I offered her an appt, but she stated she will look up the diet on line and try to follow it. Explained she has to eat easy to digest foods d/t the gastroparesis and I will mail her our diet; pt stated understanding and will call for worsening.Marland Kitchen

## 2012-06-23 ENCOUNTER — Other Ambulatory Visit: Payer: Self-pay | Admitting: Gastroenterology

## 2012-06-23 ENCOUNTER — Other Ambulatory Visit: Payer: Self-pay | Admitting: Family Medicine

## 2012-06-27 ENCOUNTER — Telehealth: Payer: Self-pay

## 2012-06-27 MED ORDER — ESOMEPRAZOLE MAGNESIUM 40 MG PO CPDR: 40.0000 mg | DELAYED_RELEASE_CAPSULE | Freq: Every day | ORAL | Status: DC

## 2012-06-27 NOTE — Telephone Encounter (Signed)
Patient's insurance will only pay for a 90 day supply, so I changed amount of pills and resent the prescription to the pharmacy.

## 2012-07-11 ENCOUNTER — Encounter: Payer: Self-pay | Admitting: Family Medicine

## 2012-07-12 ENCOUNTER — Other Ambulatory Visit (INDEPENDENT_AMBULATORY_CARE_PROVIDER_SITE_OTHER): Payer: Commercial Managed Care - PPO

## 2012-07-12 DIAGNOSIS — Z0000 Encounter for general adult medical examination without abnormal findings: Secondary | ICD-10-CM

## 2012-07-12 LAB — HEPATIC FUNCTION PANEL
AST: 21 U/L (ref 0–37)
Alkaline Phosphatase: 52 U/L (ref 39–117)
Total Bilirubin: 0.7 mg/dL (ref 0.3–1.2)

## 2012-07-12 LAB — BASIC METABOLIC PANEL
BUN: 14 mg/dL (ref 6–23)
Calcium: 9.1 mg/dL (ref 8.4–10.5)
GFR: 86.18 mL/min (ref >60.00)
Glucose, Bld: 88 mg/dL (ref 70–99)
Sodium: 139 mEq/L (ref 135–145)

## 2012-07-12 LAB — CBC WITH DIFFERENTIAL/PLATELET
Basophils Absolute: 0 10*3/uL (ref 0.0–0.1)
Hemoglobin: 13.4 g/dL (ref 12.0–15.0)
Lymphocytes Relative: 39.5 % (ref 12.0–46.0)
Monocytes Relative: 6.8 % (ref 3.0–12.0)
Platelets: 199 10*3/uL (ref 150.0–400.0)
RDW: 13.4 % (ref 11.5–14.6)
WBC: 5.7 10*3/uL (ref 4.5–10.5)

## 2012-07-12 LAB — POCT URINALYSIS DIPSTICK
Bilirubin, UA: NEGATIVE
Blood, UA: NEGATIVE
Glucose, UA: NEGATIVE
Ketones, UA: NEGATIVE
Spec Grav, UA: 1.02

## 2012-07-12 LAB — LIPID PANEL
HDL: 50.1 mg/dL (ref >39.00)
LDL Cholesterol: 113 mg/dL — ABNORMAL HIGH (ref 0–99)
Total CHOL/HDL Ratio: 4
Triglycerides: 107 mg/dL (ref 0.0–149.0)

## 2012-07-13 ENCOUNTER — Telehealth: Payer: Self-pay | Admitting: Family Medicine

## 2012-07-13 NOTE — Telephone Encounter (Signed)
I left voice message with normal lab results.

## 2012-07-13 NOTE — Progress Notes (Signed)
Quick Note:  I left voice message with results. ______ 

## 2012-07-13 NOTE — Telephone Encounter (Signed)
Patient called stating that she would like a call back with lab results. Please assist.  °

## 2012-07-18 ENCOUNTER — Encounter: Payer: Self-pay | Admitting: Family Medicine

## 2012-07-18 ENCOUNTER — Other Ambulatory Visit: Payer: Commercial Managed Care - PPO

## 2012-07-18 ENCOUNTER — Ambulatory Visit (INDEPENDENT_AMBULATORY_CARE_PROVIDER_SITE_OTHER): Payer: Commercial Managed Care - PPO | Admitting: Family Medicine

## 2012-07-18 VITALS — BP 110/72 | HR 85 | Temp 98.3°F | Ht 68.0 in | Wt 164.0 lb

## 2012-07-18 DIAGNOSIS — N390 Urinary tract infection, site not specified: Secondary | ICD-10-CM

## 2012-07-18 DIAGNOSIS — Z0000 Encounter for general adult medical examination without abnormal findings: Secondary | ICD-10-CM

## 2012-07-18 LAB — POCT URINALYSIS DIPSTICK
Bilirubin, UA: NEGATIVE
Blood, UA: NEGATIVE
Ketones, UA: NEGATIVE
pH, UA: 5

## 2012-07-18 MED ORDER — OLMESARTAN MEDOXOMIL-HCTZ 20-12.5 MG PO TABS: 1.0000 | ORAL_TABLET | Freq: Every day | ORAL | Status: DC

## 2012-07-18 MED ORDER — CIPROFLOXACIN HCL 500 MG PO TABS: 500.0000 mg | ORAL_TABLET | Freq: Two times a day (BID) | ORAL | Status: DC

## 2012-07-18 MED ORDER — ESCITALOPRAM OXALATE 20 MG PO TABS: 20.0000 mg | ORAL_TABLET | Freq: Every day | ORAL | Status: DC

## 2012-07-18 NOTE — Progress Notes (Signed)
  Subjective:    Patient ID: Pamela Daugherty, female    DOB: [birth year], 61 y.o.   MRN: [id number]  HPI 61 yr old female for a cpx. She feels well except for 4 days on urinary burning and urgency. No fever or nausea. Her UA was clear when she had her cpx labs done, but these symptoms started a few days after that.    Review of Systems  Constitutional: Negative.   HENT: Negative.   Eyes: Negative.   Respiratory: Negative.   Cardiovascular: Negative.   Gastrointestinal: Negative.   Genitourinary: Positive for dysuria, urgency and frequency. Negative for hematuria, flank pain, decreased urine volume, enuresis, difficulty urinating, pelvic pain and dyspareunia.  Musculoskeletal: Negative.   Skin: Negative.   Neurological: Negative.   Hematological: Negative.   Psychiatric/Behavioral: Negative.        Objective:   Physical Exam  Constitutional: She is oriented to person, place, and time. She appears well-developed and well-nourished. No distress.  HENT:  Head: Normocephalic and atraumatic.  Right Ear: External ear normal.  Left Ear: External ear normal.  Nose: Nose normal.  Mouth/Throat: Oropharynx is clear and moist. No oropharyngeal exudate.  Eyes: Conjunctivae normal and EOM are normal. Pupils are equal, round, and reactive to light. No scleral icterus.  Neck: Normal range of motion. Neck supple. No JVD present. No thyromegaly present.  Cardiovascular: Normal rate, regular rhythm, normal heart sounds and intact distal pulses.  Exam reveals no gallop and no friction rub.   No murmur heard.      EKG normal   Pulmonary/Chest: Effort normal and breath sounds normal. No respiratory distress. She has no wheezes. She has no rales. She exhibits no tenderness.  Abdominal: Soft. Bowel sounds are normal. She exhibits no distension and no mass. There is no tenderness. There is no rebound and no guarding.  Musculoskeletal: Normal range of motion. She exhibits no edema and no tenderness.    Lymphadenopathy:    She has no cervical adenopathy.  Neurological: She is alert and oriented to person, place, and time. She has normal reflexes. No cranial nerve deficit. She exhibits normal muscle tone. Coordination normal.  Skin: Skin is warm and dry. No rash noted. No erythema.  Psychiatric: She has a normal mood and affect. Her behavior is normal. Judgment and thought content normal.          Assessment & Plan:  Well exam. Treat her current UTI and culture the sample.

## 2012-07-18 NOTE — Addendum Note (Signed)
Addended by: Aniceto Boss A on: 07/18/2012 04:37 PM   Modules accepted: Orders

## 2012-07-19 ENCOUNTER — Other Ambulatory Visit: Payer: Self-pay

## 2012-07-20 ENCOUNTER — Other Ambulatory Visit: Payer: Self-pay | Admitting: Family Medicine

## 2012-07-23 NOTE — Progress Notes (Signed)
Quick Note:  I spoke with pt ______ 

## 2012-07-25 ENCOUNTER — Encounter: Payer: Commercial Managed Care - PPO | Admitting: Family Medicine

## 2012-10-07 ENCOUNTER — Other Ambulatory Visit: Payer: Self-pay | Admitting: Gastroenterology

## 2012-10-25 ENCOUNTER — Other Ambulatory Visit: Payer: Self-pay | Admitting: Dermatology

## 2012-11-29 ENCOUNTER — Ambulatory Visit (INDEPENDENT_AMBULATORY_CARE_PROVIDER_SITE_OTHER): Admitting: Gastroenterology

## 2012-11-29 ENCOUNTER — Other Ambulatory Visit (INDEPENDENT_AMBULATORY_CARE_PROVIDER_SITE_OTHER)

## 2012-11-29 ENCOUNTER — Other Ambulatory Visit: Payer: Self-pay | Admitting: *Deleted

## 2012-11-29 ENCOUNTER — Encounter: Payer: Self-pay | Admitting: Gastroenterology

## 2012-11-29 VITALS — BP 102/60 | HR 85 | Ht 67.5 in | Wt 154.4 lb

## 2012-11-29 DIAGNOSIS — R1084 Generalized abdominal pain: Secondary | ICD-10-CM

## 2012-11-29 DIAGNOSIS — K5901 Slow transit constipation: Secondary | ICD-10-CM

## 2012-11-29 DIAGNOSIS — R634 Abnormal weight loss: Secondary | ICD-10-CM

## 2012-11-29 DIAGNOSIS — K3184 Gastroparesis: Secondary | ICD-10-CM

## 2012-11-29 DIAGNOSIS — K599 Functional intestinal disorder, unspecified: Secondary | ICD-10-CM

## 2012-11-29 LAB — COMPREHENSIVE METABOLIC PANEL
ALT: 15 U/L (ref 0–35)
AST: 20 U/L (ref 0–37)
Alkaline Phosphatase: 57 U/L (ref 39–117)
Sodium: 138 mEq/L (ref 135–145)
Total Bilirubin: 0.5 mg/dL (ref 0.3–1.2)
Total Protein: 6.6 g/dL (ref 6.0–8.3)

## 2012-11-29 LAB — FERRITIN: Ferritin: 19 ng/mL (ref 10.0–291.0)

## 2012-11-29 LAB — CBC WITH DIFFERENTIAL/PLATELET
Basophils Absolute: 0 10*3/uL (ref 0.0–0.1)
Eosinophils Absolute: 0.2 10*3/uL (ref 0.0–0.7)
HCT: 40.4 % (ref 36.0–46.0)
Lymphs Abs: 2.5 10*3/uL (ref 0.7–4.0)
MCHC: 34 g/dL (ref 30.0–36.0)
Monocytes Absolute: 0.4 10*3/uL (ref 0.1–1.0)
Monocytes Relative: 7.5 % (ref 3.0–12.0)
Platelets: 217 10*3/uL (ref 150.0–400.0)
RDW: 13.1 % (ref 11.5–14.6)

## 2012-11-29 LAB — AMYLASE: Amylase: 46 U/L (ref 27–131)

## 2012-11-29 LAB — HEPATIC FUNCTION PANEL
AST: 20 U/L (ref 0–37)
Albumin: 3.9 g/dL (ref 3.5–5.2)
Alkaline Phosphatase: 57 U/L (ref 39–117)

## 2012-11-29 LAB — IBC PANEL
Iron: 70 ug/dL (ref 42–145)
Saturation Ratios: 18.2 % — ABNORMAL LOW (ref 20.0–50.0)

## 2012-11-29 LAB — FOLATE: Folate: 7.8 ng/mL (ref >5.9)

## 2012-11-29 MED ORDER — MOVIPREP 100 G PO SOLR: 1.0000 | Freq: Once | ORAL | Status: DC

## 2012-11-29 MED ORDER — LINACLOTIDE 145 MCG PO CAPS: 145.0000 ug | ORAL_CAPSULE | Freq: Every day | ORAL | Status: DC

## 2012-11-29 NOTE — Progress Notes (Signed)
This is a very pleasant 62 year old Caucasian female who continues with a rather constant dull lower abdominal discomfort and severe constipation.  She" feels like I have cancer that nobody can diagnose".  She denies rectal bleeding, melena, but does have confirmed gastroparesis with an abnormal gastric emptying scan several years ago.  Treatment with domperidone 10 mg 3 times a day has not helped her early satiety and regurgitation.  She does take Nexium 40 mg regularly, but has not used this over the last month.  She will go 3-4 days without a bowel movement with relief with laxative use.  There no swallowing difficulties, bladder emptying problems, or other neuromuscular issues.  The patient has lost weight from 194-154.  Previous endoscopy and colonoscopy were in July of 2010.  There is no history of gynecologic abnormalities, and she's had normal GYN exams and mammograms.  Abdominal pain is described as a constant dull aching discomfort pressure relieved by bowel movement.  She denies any hepatobiliary problems.  Upper abdominal ultrasound exam also is normal in 2010.  Lab review from September of 2013 shows normal CBC, metabolic and liver profiles.  Family history is noncontributory.  Current Medications, Allergies, Past Medical History, Past Surgical History, Family History and Social History were reviewed in [employer] record.  ROS: All systems were reviewed and are negative unless otherwise stated in the HPI.          Physical Exam: Blood pressure 102/80, pulse 85 and regular, and weight 154 with BMI of 23.82.  Oxygen saturation 98%.  I cannot appreciate stigmata of chronic liver disease. She is a healthy-appearing patient in no distress.  Chest is clear and she is in a regular rhythm without murmurs gallops or rubs.  I cannot appreciate hepatosplenomegaly, abdominal masses or tenderness.  Bowel sounds are hypoactive.  Peripheral extremities are unremarkable, mental status  is normal.  He does have an appendectomy scar on the right lower quadrant.    Assessment and Plan: This patient seems to have a GI motility disorder with gastroparesis and severe constipation.  I am concerned about progressive  weight loss and abdominal pain.  I have rescheduled endoscopy and colonoscopy, and will check screening labs today.  If her exams were unremarkable, we will perform abdominal-pelvic CT scan, and perhaps pill camera enteroscopy.  There is no known history of endocrine dysfunction, and her symptoms do not seem consistent with carcinoid syndrome or small bowel obstruction.  I have stopped her domperidone I will begin a trial of Linzess 145 mcg a day with liberal by mouth fluids.  High-resolution esophageal manometry also may be a day to ascertain she has a underlying defined motility disorder.  I reviewed her chart, radiographs, and endoscopic reports in detail.

## 2012-11-29 NOTE — Patient Instructions (Addendum)
You have been scheduled for an endoscopy and colonoscopy with propofol. Please follow the written instructions given to you at your visit today. Please pick up your prep at the pharmacy within the next 1-3 days. If you use inhalers (even only as needed) or a CPAP machine, please bring them with you on the day of your procedure.  Your physician has requested that you go to the basement for lab work before leaving today.  Please stop your Domperidone    We have given you samples of the following medication to take: Linzess, please take one capsule by mouth once daily. If this works well for you please call back for a prescription.  Cc: Dr. Gershon Crane

## 2012-12-03 ENCOUNTER — Ambulatory Visit (INDEPENDENT_AMBULATORY_CARE_PROVIDER_SITE_OTHER): Admitting: Gastroenterology

## 2012-12-03 DIAGNOSIS — E538 Deficiency of other specified B group vitamins: Secondary | ICD-10-CM

## 2012-12-03 MED ORDER — CYANOCOBALAMIN 1000 MCG/ML IJ SOLN
1000.0000 ug | INTRAMUSCULAR | Status: AC
  Administered 2012-12-03 – 2012-12-17 (×2): 1000 ug via INTRAMUSCULAR

## 2012-12-10 ENCOUNTER — Ambulatory Visit (INDEPENDENT_AMBULATORY_CARE_PROVIDER_SITE_OTHER): Admitting: Gastroenterology

## 2012-12-10 DIAGNOSIS — E538 Deficiency of other specified B group vitamins: Secondary | ICD-10-CM

## 2012-12-10 MED ORDER — CYANOCOBALAMIN 1000 MCG/ML IJ SOLN
1000.0000 ug | Freq: Once | INTRAMUSCULAR | Status: AC
  Administered 2012-12-10: 1000 ug via INTRAMUSCULAR

## 2012-12-12 ENCOUNTER — Ambulatory Visit (AMBULATORY_SURGERY_CENTER): Admitting: Gastroenterology

## 2012-12-12 ENCOUNTER — Encounter: Payer: Self-pay | Admitting: Gastroenterology

## 2012-12-12 VITALS — BP 116/69 | HR 56 | Temp 97.7°F | Resp 14 | Ht 67.5 in | Wt 154.0 lb

## 2012-12-12 DIAGNOSIS — D126 Benign neoplasm of colon, unspecified: Secondary | ICD-10-CM

## 2012-12-12 DIAGNOSIS — K2970 Gastritis, unspecified, without bleeding: Secondary | ICD-10-CM

## 2012-12-12 DIAGNOSIS — K209 Esophagitis, unspecified without bleeding: Secondary | ICD-10-CM

## 2012-12-12 DIAGNOSIS — K589 Irritable bowel syndrome without diarrhea: Secondary | ICD-10-CM

## 2012-12-12 DIAGNOSIS — R634 Abnormal weight loss: Secondary | ICD-10-CM

## 2012-12-12 DIAGNOSIS — K5989 Other specified functional intestinal disorders: Secondary | ICD-10-CM

## 2012-12-12 DIAGNOSIS — R1031 Right lower quadrant pain: Secondary | ICD-10-CM

## 2012-12-12 DIAGNOSIS — Z1211 Encounter for screening for malignant neoplasm of colon: Secondary | ICD-10-CM

## 2012-12-12 DIAGNOSIS — R112 Nausea with vomiting, unspecified: Secondary | ICD-10-CM

## 2012-12-12 DIAGNOSIS — K2990 Gastroduodenitis, unspecified, without bleeding: Secondary | ICD-10-CM

## 2012-12-12 DIAGNOSIS — K3184 Gastroparesis: Secondary | ICD-10-CM

## 2012-12-12 DIAGNOSIS — K5901 Slow transit constipation: Secondary | ICD-10-CM

## 2012-12-12 DIAGNOSIS — R1084 Generalized abdominal pain: Secondary | ICD-10-CM

## 2012-12-12 DIAGNOSIS — K219 Gastro-esophageal reflux disease without esophagitis: Secondary | ICD-10-CM

## 2012-12-12 MED ORDER — ESOMEPRAZOLE MAGNESIUM 40 MG PO CPDR: 40.0000 mg | DELAYED_RELEASE_CAPSULE | Freq: Every day | ORAL | Status: DC

## 2012-12-12 MED ORDER — SODIUM CHLORIDE 0.9 % IV SOLN: 500.0000 mL | INTRAVENOUS | Status: DC

## 2012-12-12 NOTE — Progress Notes (Signed)
Pt drank the movip rep and last pm she had N&V and she reported vomiting up bright, red blood about a handful.  This am dose she tolerated the moviprep okay.  She did not have any N&V.  Results of prep were clear yellow liquid.  Maw

## 2012-12-12 NOTE — Patient Instructions (Addendum)

## 2012-12-12 NOTE — Op Note (Signed)
[facility] [address] [place], [address]   ENDOSCOPY PROCEDURE REPORT  PATIENT: Pamela, Daugherty  MR#: [id number] BIRTHDATE: 11-22-1950 , 61  yrs. old GENDER: Female ENDOSCOPIST:David Hale Bogus, MD, Clementeen Graham REFERRED BY: Gershon Crane, M.D. PROCEDURE DATE:  12/12/2012 PROCEDURE:   EGD w/ biopsy and EGD w/ biopsy for H.pylori ASA CLASS:    Class II INDICATIONS: Dyspepsia and Weight loss. MEDICATION: There was residual sedation effect present from prior procedure and propofol (Diprivan) 200mg  IV TOPICAL ANESTHETIC:  DESCRIPTION OF PROCEDURE:   After the risks and benefits of the procedure were explained, informed consent was obtained.  The LB GIF-H180 [id number]  endoscope was introduced through the mouth  and advanced to the second portion of the duodenum .  The instrument was slowly withdrawn as the mucosa was fully examined.    the endoscope was easily advanced in the second portion of the duodenum.  Duodenal bulb and post bulbar area appeared normal. Because endoscope in the stomach show rather diffuse erosive gastritis with some inflammatory polyps.  She was obtained for pathologic exam and for CLO testing.  Pictures also obtained for documentation.  Retroflexed view the fundus and cardia the stomach showed a 3-4 cm hiatal hernia.  Withdrawal of the endoscope into the esophagus showed rather severe exudative erosive esophagitis in the distal 4 cm of the esophagus.  Again biopsies were obtained as per pictures.  The proxima and middlel esophagus appeared normal. The patient is expected without difficulty.  Retroflexed views revealed a hiatal hernia.    The scope was then withdrawn from the patient and the procedure completed.  COMPLICATIONS: There were no complications.   ENDOSCOPIC IMPRESSION:diffuse gastritis, rule out H. pylori infection versus hyperacidity.  This patient also has severe erosive esophagitis.  Biopsies were obtained to exclude  Barrett's mucosa.  RECOMMENDATIONS: 1.  Await pathology results 2.  Rx CLO if positive 3. Restart Nexium 40 mg/day,#30,refill x6.   _______________________________ eSignedMardella Layman, MD, [facility] 12/12/2012 11:08 AM   standard discharge   PATIENT NAME:  Pamela, Daugherty MR#: [id number]

## 2012-12-12 NOTE — Op Note (Signed)
 [facility] [address] [place], [address]   COLONOSCOPY PROCEDURE REPORT  PATIENT: Machell, Wirthlin  MR#: [id number] BIRTHDATE: 1951/01/01 , 61  yrs. old GENDER: Female ENDOSCOPIST: Mardella Layman, MD, Clementeen Graham REFERRED BY:  Gershon Crane, M.D. PROCEDURE DATE:  12/12/2012 PROCEDURE:   Colonoscopy, screening and Colonoscopy with biopsy ASA CLASS:   Class II INDICATIONS:Average risk patient for colon cancer, fatigue, and Weight loss. MEDICATIONS: propofol (Diprivan) 200mg  IV  DESCRIPTION OF PROCEDURE:   After the risks and benefits and of the procedure were explained, informed consent was obtained.  A digital rectal exam revealed no abnormalities of the rectum.    The LB CF-H180AL [id number]  endoscope was introduced through the anus and advanced to the cecum, which was identified by both the appendix and ileocecal valve .  The quality of the prep was excellent, using MoviPrep .  The instrument was then slowly withdrawn as the colon was fully examined.     COLON FINDINGS: A normal appearing cecum, ileocecal valve, and appendiceal orifice were identified.  The ascending, hepatic flexure, transverse, splenic flexure, descending, sigmoid colon and rectum appeared unremarkable.  No polyps or cancers were seen. Multiple biopsies were performed.     Retroflexed views revealed no abnormalities.     The scope was then withdrawn from the patient and the procedure completed.  COMPLICATIONS: There were no complications. ENDOSCOPIC IMPRESSION: Normal colon; multiple biopsies were performed ..r/o collagenous/microscopic colitis...  RECOMMENDATIONS: 1.  Await biopsy results 2.  Upper endoscopy will be scheduled   REPEAT EXAM:  cc:  _______________________________ eSignedMardella Layman, MD, [facility] 12/12/2012 10:59 AM

## 2012-12-12 NOTE — Progress Notes (Signed)
Called to room to assist during endoscopic procedure.  Patient ID and intended procedure confirmed with present staff. Received instructions for my participation in the procedure from the performing physician.  

## 2012-12-12 NOTE — Progress Notes (Signed)
Patient did not experience any of the following events: a burn prior to discharge; a fall within the facility; wrong site/side/patient/procedure/implant event; or a hospital transfer or hospital admission upon discharge from the facility. (G8907) Patient did not have preoperative order for IV antibiotic SSI prophylaxis. (G8918)  

## 2012-12-13 ENCOUNTER — Telehealth: Payer: Self-pay

## 2012-12-13 LAB — HELICOBACTER PYLORI SCREEN-BIOPSY: UREASE: NEGATIVE

## 2012-12-13 NOTE — Telephone Encounter (Signed)
  Follow up Call-  Call back number 12/12/2012  Post procedure Call Back phone  # [phone] hm  Permission to leave phone message Yes     Patient questions:  Do you have a fever, pain , or abdominal swelling? no Pain Score  0 *  Have you tolerated food without any problems? yes  Have you been able to return to your normal activities? yes  Do you have any questions about your discharge instructions: Diet   no Medications  no Follow up visit  no  Do you have questions or concerns about your Care? no  Actions: * If pain score is 4 or above: No action needed, pain <4.

## 2012-12-14 ENCOUNTER — Encounter: Payer: Self-pay | Admitting: Gastroenterology

## 2012-12-17 ENCOUNTER — Encounter: Payer: Self-pay | Admitting: Gastroenterology

## 2012-12-17 ENCOUNTER — Ambulatory Visit (INDEPENDENT_AMBULATORY_CARE_PROVIDER_SITE_OTHER): Admitting: Gastroenterology

## 2012-12-17 DIAGNOSIS — D509 Iron deficiency anemia, unspecified: Secondary | ICD-10-CM

## 2012-12-18 ENCOUNTER — Telehealth: Payer: Self-pay | Admitting: Family Medicine

## 2012-12-18 NOTE — Telephone Encounter (Signed)
Pt needs new rxs benicar hct 20-12.5 mg and lexapro 20 mg #90 each with 3 refills  to sent to [facility]. Please call pt when ready for pick up

## 2012-12-19 MED ORDER — ESCITALOPRAM OXALATE 20 MG PO TABS: 20.0000 mg | ORAL_TABLET | Freq: Every day | ORAL | Status: DC

## 2012-12-19 MED ORDER — OLMESARTAN MEDOXOMIL-HCTZ 20-12.5 MG PO TABS: 1.0000 | ORAL_TABLET | Freq: Every day | ORAL | Status: DC

## 2012-12-19 NOTE — Telephone Encounter (Signed)
Refill both for one year. 

## 2012-12-19 NOTE — Telephone Encounter (Signed)
Scripts are ready for pick up and left voice message for pt.

## 2012-12-24 ENCOUNTER — Telehealth: Payer: Self-pay | Admitting: Family Medicine

## 2012-12-24 NOTE — Telephone Encounter (Signed)
Pt requested Lexapro to be sent to [facility] fax # [phone] and be sure to put pt's social on it and I did fax this script. Per pt put a hold on the [place] script, pt not sure if she will have to continue with this medication or not. She is going to discuss this with Jaecob Lowden before ordering.

## 2013-01-15 ENCOUNTER — Other Ambulatory Visit: Payer: Self-pay | Admitting: Radiology

## 2013-01-25 ENCOUNTER — Emergency Department: Payer: Self-pay | Admitting: Emergency Medicine

## 2013-01-28 ENCOUNTER — Encounter: Payer: Self-pay | Admitting: Family Medicine

## 2013-02-18 ENCOUNTER — Encounter: Payer: Self-pay | Admitting: Family Medicine

## 2013-02-18 ENCOUNTER — Ambulatory Visit (INDEPENDENT_AMBULATORY_CARE_PROVIDER_SITE_OTHER): Admitting: Family Medicine

## 2013-02-18 VITALS — BP 154/80 | HR 81 | Temp 98.0°F | Wt 158.0 lb

## 2013-02-18 DIAGNOSIS — S0093XD Contusion of unspecified part of head, subsequent encounter: Secondary | ICD-10-CM

## 2013-02-18 DIAGNOSIS — R42 Dizziness and giddiness: Secondary | ICD-10-CM

## 2013-02-18 DIAGNOSIS — Z5189 Encounter for other specified aftercare: Secondary | ICD-10-CM

## 2013-02-18 DIAGNOSIS — I10 Essential (primary) hypertension: Secondary | ICD-10-CM

## 2013-02-18 MED ORDER — OLMESARTAN MEDOXOMIL-HCTZ 20-12.5 MG PO TABS: 1.0000 | ORAL_TABLET | Freq: Every day | ORAL | Status: DC

## 2013-02-18 MED ORDER — MECLIZINE HCL 25 MG PO TABS: 25.0000 mg | ORAL_TABLET | ORAL | Status: DC | PRN

## 2013-02-18 NOTE — Progress Notes (Signed)
  Subjective:    Patient ID: Pamela Daugherty, female    DOB: 25-Aug-1951, 62 y.o.   MRN: [id number]  HPI Here for several things. First she has changed pharmacies and needs her BP med refilled. Also she wants me to check her face after she fell on 01-25-13. She was coming out of a restaurant that day and missed a step. She fell forward and struck her forehead on the pavement. No LOC but she was taken to [facility] ER. Her head CT was clear. She had a large goose egg on her left forehead which is getting smaller. No residual effects apparently. No HAs. She has had more vertigo over the past 3 months however. Using Meclizine prn.    Review of Systems  Constitutional: Negative.   HENT: Negative.   Eyes: Negative.   Respiratory: Negative.   Cardiovascular: Negative.   Neurological: Positive for dizziness. Negative for tremors, seizures, syncope, speech difficulty, weakness, light-headedness, numbness and headaches.       Objective:   Physical Exam  Constitutional: She is oriented to person, place, and time. She appears well-developed and well-nourished. No distress.  HENT:  Right Ear: External ear normal.  Left Ear: External ear normal.  Nose: Nose normal.  Mouth/Throat: Oropharynx is clear and moist.  Slight swelling over the left eyebrow but not tender   Eyes: Conjunctivae and EOM are normal. Pupils are equal, round, and reactive to light.  Neck: Neck supple. No thyromegaly present.  Lymphadenopathy:    She has no cervical adenopathy.  Neurological: She is alert and oriented to person, place, and time. No cranial nerve deficit. Coordination normal.          Assessment & Plan:  Her forehead contusion is healing as expected. For the vertigo she will start on daily Claritin and use Meclizine prn. Refilled Benicar HCT.

## 2013-05-20 ENCOUNTER — Encounter: Payer: Self-pay | Admitting: Family Medicine

## 2013-05-20 ENCOUNTER — Ambulatory Visit (INDEPENDENT_AMBULATORY_CARE_PROVIDER_SITE_OTHER): Admitting: Family Medicine

## 2013-05-20 VITALS — BP 128/70 | Temp 98.2°F | Wt 159.0 lb

## 2013-05-20 DIAGNOSIS — N390 Urinary tract infection, site not specified: Secondary | ICD-10-CM

## 2013-05-20 LAB — POCT URINALYSIS DIPSTICK
Nitrite, UA: NEGATIVE
Urobilinogen, UA: 0.2
pH, UA: 6

## 2013-05-20 MED ORDER — CIPROFLOXACIN HCL 500 MG PO TABS: 500.0000 mg | ORAL_TABLET | Freq: Two times a day (BID) | ORAL | Status: DC

## 2013-05-20 NOTE — Progress Notes (Signed)
  Subjective:    Patient ID: Pamela Daugherty, female    DOB: Mar 09, 1951, 62 y.o.   MRN: [id number]  HPI Here for intermittent lower abdominal cramps, urgency to urinate and blood in the urine for 4 weeks. Drinking fluids and taking Azo. No fever.    Review of Systems  Constitutional: Negative.   Genitourinary: Positive for dysuria, urgency, frequency, hematuria and pelvic pain.       Objective:   Physical Exam  Constitutional: She appears well-developed and well-nourished.  Abdominal: Soft. Bowel sounds are normal. She exhibits no distension and no mass. There is no tenderness. There is no rebound and no guarding.          Assessment & Plan:  Treat with Cipro. Culture the sample

## 2013-05-20 NOTE — Addendum Note (Signed)
Addended by: Aniceto Boss A on: 05/20/2013 01:58 PM   Modules accepted: Orders

## 2013-05-22 LAB — URINE CULTURE
Colony Count: NO GROWTH
Organism ID, Bacteria: NO GROWTH

## 2013-05-24 NOTE — Progress Notes (Signed)
Quick Note:  I left voice message with results. ______ 

## 2013-07-24 ENCOUNTER — Encounter: Payer: Self-pay | Admitting: Family Medicine

## 2013-08-19 ENCOUNTER — Telehealth: Payer: Self-pay | Admitting: Family Medicine

## 2013-08-19 DIAGNOSIS — K219 Gastro-esophageal reflux disease without esophagitis: Secondary | ICD-10-CM

## 2013-08-19 DIAGNOSIS — K209 Esophagitis, unspecified without bleeding: Secondary | ICD-10-CM

## 2013-08-19 NOTE — Telephone Encounter (Signed)
Pt requesting refill of esomeprazole (NEXIUM) 40 MG capsule faxed to [facility].  Fax must include pt's name, dob and address.

## 2013-08-19 NOTE — Telephone Encounter (Signed)
Fax number [phone], see previous message regarding refill request.

## 2013-08-20 MED ORDER — ESOMEPRAZOLE MAGNESIUM 40 MG PO CPDR: 40.0000 mg | DELAYED_RELEASE_CAPSULE | Freq: Every day | ORAL | Status: DC

## 2013-08-20 NOTE — Telephone Encounter (Signed)
I faxed script to below number. 

## 2013-10-11 ENCOUNTER — Ambulatory Visit (INDEPENDENT_AMBULATORY_CARE_PROVIDER_SITE_OTHER): Admitting: Family Medicine

## 2013-10-11 ENCOUNTER — Encounter: Payer: Self-pay | Admitting: Family Medicine

## 2013-10-11 VITALS — BP 122/76 | HR 87 | Temp 99.0°F | Ht 67.75 in | Wt 175.0 lb

## 2013-10-11 DIAGNOSIS — I10 Essential (primary) hypertension: Secondary | ICD-10-CM

## 2013-10-11 DIAGNOSIS — Z0000 Encounter for general adult medical examination without abnormal findings: Secondary | ICD-10-CM

## 2013-10-11 DIAGNOSIS — Z23 Encounter for immunization: Secondary | ICD-10-CM

## 2013-10-11 LAB — CBC WITH DIFFERENTIAL/PLATELET
Basophils Absolute: 0 10*3/uL (ref 0.0–0.1)
Basophils Relative: 0.7 % (ref 0.0–3.0)
Eosinophils Absolute: 0.2 10*3/uL (ref 0.0–0.7)
HCT: 38.5 % (ref 36.0–46.0)
Hemoglobin: 13.1 g/dL (ref 12.0–15.0)
Lymphocytes Relative: 40 % (ref 12.0–46.0)
MCHC: 33.9 g/dL (ref 30.0–36.0)
Monocytes Absolute: 0.4 10*3/uL (ref 0.1–1.0)
Monocytes Relative: 6.9 % (ref 3.0–12.0)
Neutrophils Relative %: 49.1 % (ref 43.0–77.0)
RBC: 4.51 Mil/uL (ref 3.87–5.11)
RDW: 12.9 % (ref 11.5–14.6)
WBC: 5.7 10*3/uL (ref 4.5–10.5)

## 2013-10-11 LAB — POCT URINALYSIS DIPSTICK
Bilirubin, UA: NEGATIVE
Blood, UA: NEGATIVE
Glucose, UA: NEGATIVE
Ketones, UA: NEGATIVE
Leukocytes, UA: NEGATIVE
Nitrite, UA: NEGATIVE
Spec Grav, UA: 1.02
pH, UA: 7

## 2013-10-11 LAB — LIPID PANEL
Cholesterol: 198 mg/dL (ref 0–200)
VLDL: 22.8 mg/dL (ref 0.0–40.0)

## 2013-10-11 LAB — BASIC METABOLIC PANEL
BUN: 11 mg/dL (ref 6–23)
CO2: 28 mEq/L (ref 19–32)
Chloride: 105 mEq/L (ref 96–112)
GFR: 94.75 mL/min (ref >60.00)
Glucose, Bld: 89 mg/dL (ref 70–99)
Potassium: 4.3 mEq/L (ref 3.5–5.1)
Sodium: 140 mEq/L (ref 135–145)

## 2013-10-11 LAB — HEPATIC FUNCTION PANEL
ALT: 15 U/L (ref 0–35)
AST: 21 U/L (ref 0–37)
Albumin: 4.2 g/dL (ref 3.5–5.2)
Alkaline Phosphatase: 59 U/L (ref 39–117)

## 2013-10-11 LAB — TSH: TSH: 1.57 u[IU]/mL (ref 0.35–5.50)

## 2013-10-11 NOTE — Progress Notes (Signed)
   Subjective:    Patient ID: Pamela Daugherty, female    DOB: Jul 01, 1951, 62 y.o.   MRN: [id number]  HPI 62 yr old female for a cpx. She feels well.    Review of Systems     Objective:   Physical Exam        Assessment & Plan:

## 2013-10-11 NOTE — Addendum Note (Signed)
Addended by: Aniceto Boss A on: 10/11/2013 09:59 AM   Modules accepted: Orders

## 2013-10-11 NOTE — Progress Notes (Signed)
Pre visit review using our clinic review tool, if applicable. No additional management support is needed unless otherwise documented below in the visit note. 

## 2013-12-01 IMAGING — CT CT CERVICAL SPINE WITHOUT CONTRAST
1 series · 12 of 14 positions shown, 15 images · non-contrast
Comparison: none

REASON FOR EXAM: fall
COMMENTS:

[Series 4: axial · axial · 0.33mm/px · z∈[-167,-3]mm · 12 of 104 slices shown, 15 images]
[im 8/104  soft-tissue]
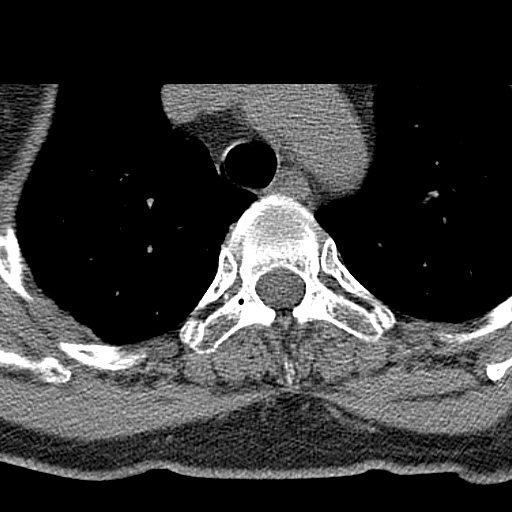
[im 8/104  bone]
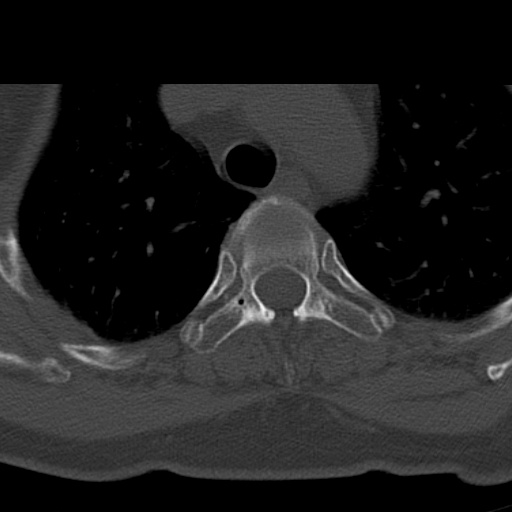
[im 16/104  bone]
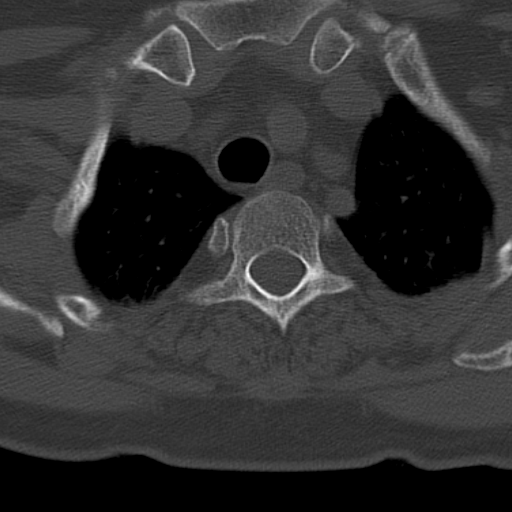
[im 24/104  bone]
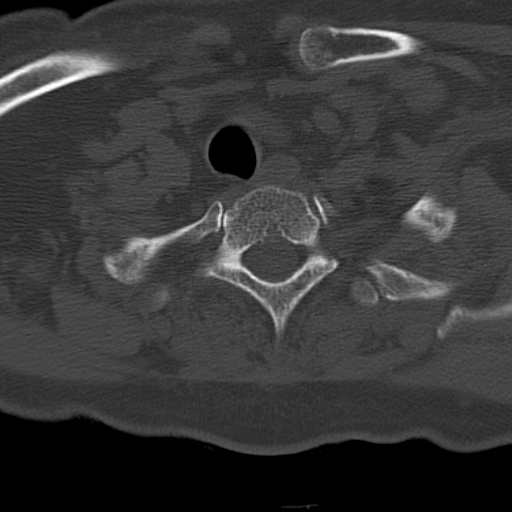
[im 32/104  bone]
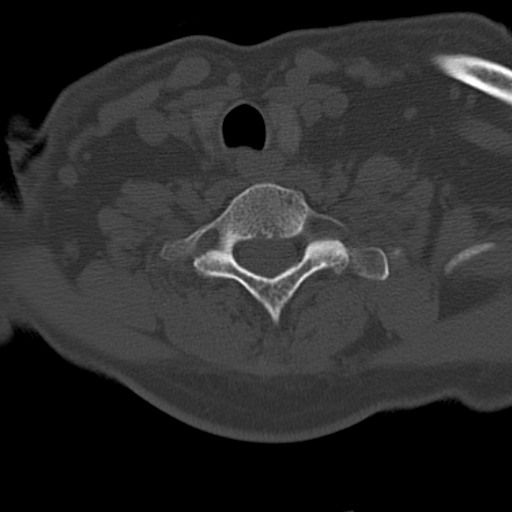
[im 40/104  soft-tissue]
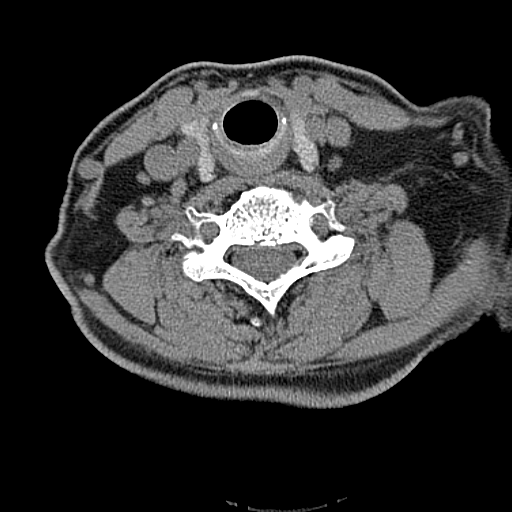
[im 40/104  bone]
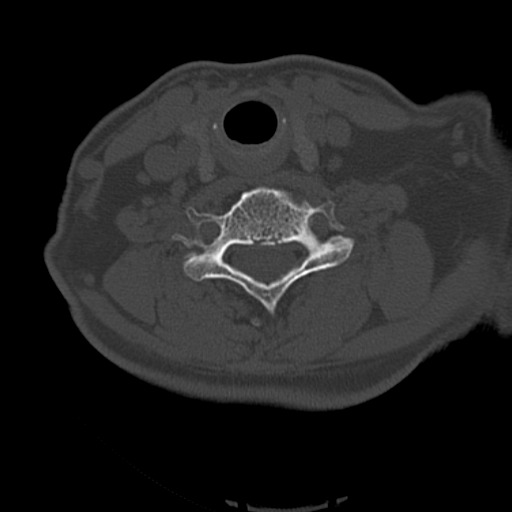
[im 48/104  bone]
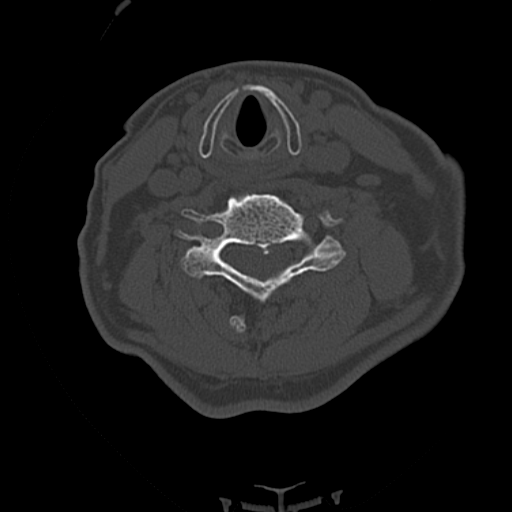
[im 56/104  bone]
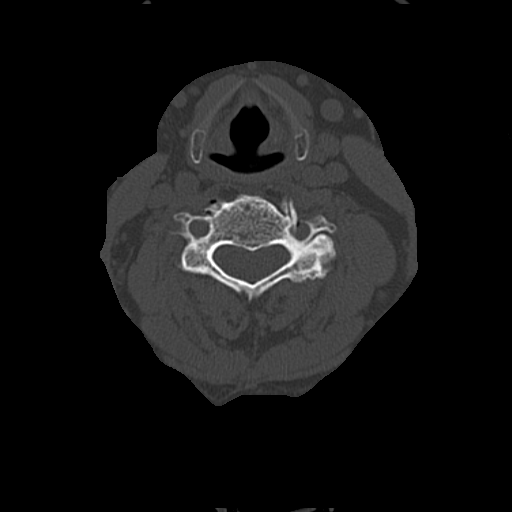
[im 64/104  bone]
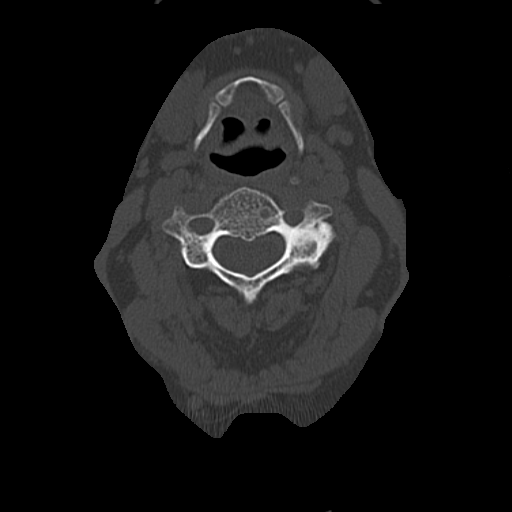
[im 72/104  soft-tissue]
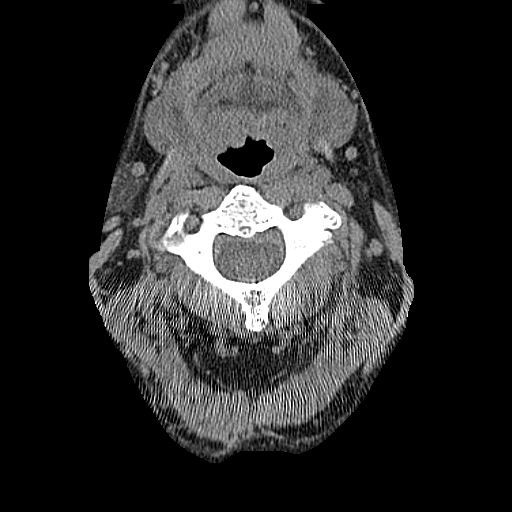
[im 72/104  bone]
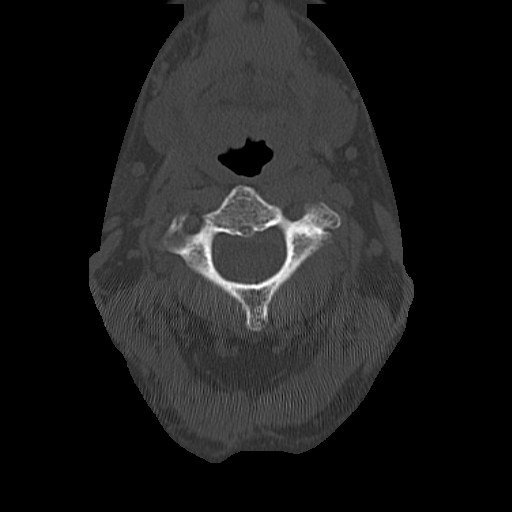
[im 80/104  bone]
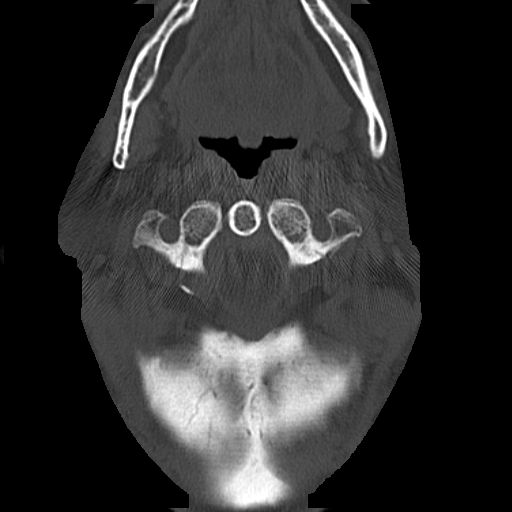
[im 88/104  bone]
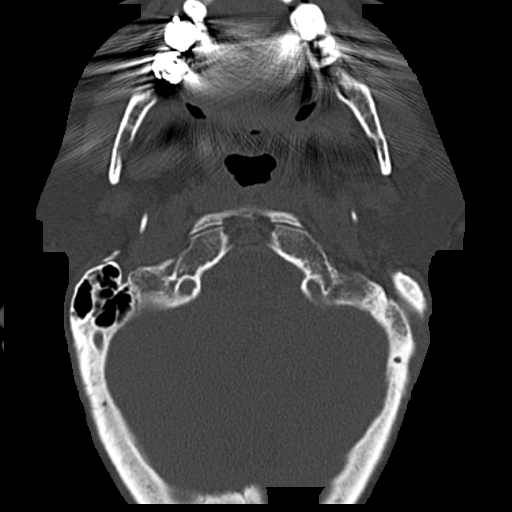
[im 96/104  bone]
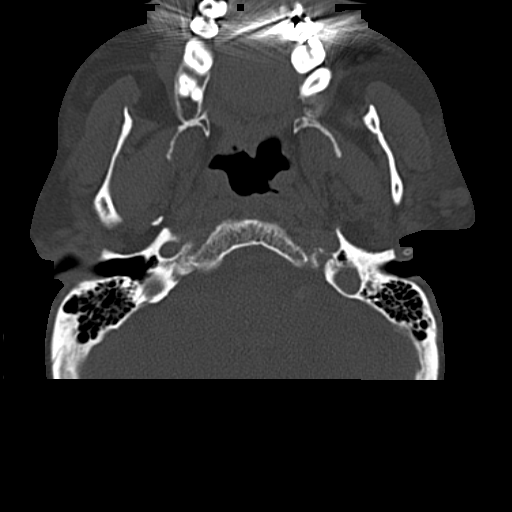

[12 of 14 positions shown; findings below may reference images not displayed]

PROCEDURE:     CT  - CT CERVICAL SPINE WO  - January 25, 2013  [DATE]

RESULT:     Sagittal, axial, and coronal images through the cervical spine
are reviewed.

The cervical vertebral bodies are preserved in height. There is high-grade
disc space narrowing at C4-C5 and at C6-C7 and to a lesser extent at C5-C6.
There are anterior and posterior osteophytes at these levels. There is no
evidence of a perched facet or spinous process fracture. The prevertebral
soft tissue spaces appear normal. The odontoid is intact and the lateral
masses of C1 align normally with those of C2. The bony ring at each cervical
level is intact.
IMPRESSION: There is no evidence of an acute cervical spine fracture
nor dislocation. There is moderate degenerative change of the mid cervical
spine.

[REDACTED]

## 2013-12-12 ENCOUNTER — Other Ambulatory Visit: Payer: Self-pay | Admitting: Family Medicine

## 2013-12-12 ENCOUNTER — Telehealth: Payer: Self-pay | Admitting: Family Medicine

## 2013-12-12 MED ORDER — CIPROFLOXACIN HCL 500 MG PO TABS: 500.0000 mg | ORAL_TABLET | Freq: Two times a day (BID) | ORAL | Status: DC

## 2013-12-12 NOTE — Telephone Encounter (Signed)
Call in Cipro 500 mg bid for 7 days, she needs to drink plenty of water

## 2013-12-12 NOTE — Telephone Encounter (Signed)
I sent script e-scribe and left a voice message for pt. 

## 2013-12-12 NOTE — Telephone Encounter (Signed)
Pt is requesting Cipro, she feels the urge to urinate and then can't, also when she does go it burns a little. She has no other symptom and no fever, just wants to know if we can call this in for her?

## 2014-01-28 ENCOUNTER — Telehealth: Payer: Self-pay | Admitting: Family Medicine

## 2014-01-28 NOTE — Telephone Encounter (Signed)
Ok x 1 month 30  if dr fry prescribing

## 2014-01-28 NOTE — Telephone Encounter (Signed)
Can we refill the Lexapro?

## 2014-01-28 NOTE — Telephone Encounter (Signed)
Pt req rell on rx escitalopram (LEXAPRO) 20 MG tablet and also olmesartan-hydrochlorothiazide (BENICAR HCT) 20-12.5 MG per tablet   Pharmacy;;  [facility]

## 2014-01-29 MED ORDER — OLMESARTAN MEDOXOMIL-HCTZ 20-12.5 MG PO TABS: 1.0000 | ORAL_TABLET | Freq: Every day | ORAL | Status: DC

## 2014-01-29 NOTE — Telephone Encounter (Signed)
I spoke with pt and she has enough of the Lexapro to last and can wait until Dr. Sarajane Jews gets back in the office next week. I sent the Benicar e-scribe and I will forward the other request to the doctor.

## 2014-02-04 MED ORDER — ESCITALOPRAM OXALATE 20 MG PO TABS: 20.0000 mg | ORAL_TABLET | Freq: Every day | ORAL | Status: DC

## 2014-02-04 NOTE — Telephone Encounter (Signed)
Refill both for one year. 

## 2014-02-04 NOTE — Telephone Encounter (Signed)
I printed and faxed script to [facility] for Lexapro.

## 2014-04-07 ENCOUNTER — Telehealth: Payer: Self-pay | Admitting: Family Medicine

## 2014-04-07 DIAGNOSIS — K209 Esophagitis, unspecified without bleeding: Secondary | ICD-10-CM

## 2014-04-07 DIAGNOSIS — K219 Gastro-esophageal reflux disease without esophagitis: Secondary | ICD-10-CM

## 2014-04-07 MED ORDER — ESOMEPRAZOLE MAGNESIUM 40 MG PO CPDR: 40.0000 mg | DELAYED_RELEASE_CAPSULE | Freq: Every day | ORAL | Status: DC

## 2014-04-07 NOTE — Telephone Encounter (Signed)
Rx sent to the pharmacy by e-script.//AB/CMA 

## 2014-04-07 NOTE — Telephone Encounter (Signed)
Pt is needing new rx for esomeprazole (NEXIUM) 40 MG capsule, sent to [facility] 90 day supply.

## 2014-06-12 ENCOUNTER — Telehealth: Payer: Self-pay | Admitting: Family Medicine

## 2014-06-12 MED ORDER — OLMESARTAN MEDOXOMIL-HCTZ 20-12.5 MG PO TABS: 1.0000 | ORAL_TABLET | Freq: Every day | ORAL | Status: DC

## 2014-06-12 NOTE — Telephone Encounter (Signed)
Pt needs refill on benicar hct 20-12.5 mg #90 w/refills [facility]

## 2014-06-23 ENCOUNTER — Telehealth: Payer: Self-pay | Admitting: Family Medicine

## 2014-06-23 NOTE — Telephone Encounter (Signed)
Pt called said pharmacy did not received the following rx olmesartan-hydrochlorothiazide (BENICAR HCT) 20-12.5 MG per tablet  req rx to be sent again    Pharmacy [facility]

## 2014-06-24 ENCOUNTER — Telehealth: Payer: Self-pay | Admitting: Family Medicine

## 2014-06-24 NOTE — Telephone Encounter (Signed)
Pt is waiting on mailorder in needs benicar #14 sent to [facility] in [place]

## 2014-06-25 ENCOUNTER — Telehealth: Payer: Self-pay | Admitting: Family Medicine

## 2014-06-25 MED ORDER — OLMESARTAN MEDOXOMIL-HCTZ 20-12.5 MG PO TABS: 1.0000 | ORAL_TABLET | Freq: Every day | ORAL | Status: DC

## 2014-06-25 NOTE — Telephone Encounter (Signed)
I printed script and it was faxed to [facility]. We had to resend this.

## 2014-06-25 NOTE — Telephone Encounter (Signed)
I spoke with pt and she is going to check with mail order, script was sent in on 06/12/14.

## 2014-06-25 NOTE — Telephone Encounter (Signed)
I called in a 30 day supply to local pharmacy. 

## 2014-07-29 ENCOUNTER — Encounter: Payer: Self-pay | Admitting: Family Medicine

## 2014-07-31 ENCOUNTER — Telehealth: Payer: Self-pay | Admitting: Family Medicine

## 2014-07-31 NOTE — Telephone Encounter (Signed)
Pt request refill of the following: meclizine (ANTIVERT) 25 MG tablet    Phamacy:

## 2014-08-05 MED ORDER — MECLIZINE HCL 25 MG PO TABS: 25.0000 mg | ORAL_TABLET | ORAL | Status: DC | PRN

## 2014-08-05 NOTE — Telephone Encounter (Signed)
I sent script e-scribe. 

## 2014-10-13 ENCOUNTER — Ambulatory Visit (INDEPENDENT_AMBULATORY_CARE_PROVIDER_SITE_OTHER): Admitting: Family Medicine

## 2014-10-13 ENCOUNTER — Encounter: Payer: Self-pay | Admitting: Family Medicine

## 2014-10-13 VITALS — BP 130/77 | HR 74 | Temp 98.2°F | Ht 67.75 in | Wt 180.0 lb

## 2014-10-13 DIAGNOSIS — Z0000 Encounter for general adult medical examination without abnormal findings: Secondary | ICD-10-CM

## 2014-10-13 DIAGNOSIS — Z23 Encounter for immunization: Secondary | ICD-10-CM

## 2014-10-13 DIAGNOSIS — K209 Esophagitis, unspecified without bleeding: Secondary | ICD-10-CM

## 2014-10-13 DIAGNOSIS — K219 Gastro-esophageal reflux disease without esophagitis: Secondary | ICD-10-CM

## 2014-10-13 LAB — CBC WITH DIFFERENTIAL/PLATELET
BASOS ABS: 0 10*3/uL (ref 0.0–0.1)
Basophils Relative: 0.6 % (ref 0.0–3.0)
EOS ABS: 0.2 10*3/uL (ref 0.0–0.7)
Eosinophils Relative: 3.4 % (ref 0.0–5.0)
HCT: 39.6 % (ref 36.0–46.0)
HEMOGLOBIN: 12.9 g/dL (ref 12.0–15.0)
LYMPHS ABS: 2.4 10*3/uL (ref 0.7–4.0)
LYMPHS PCT: 35 % (ref 12.0–46.0)
MCHC: 32.7 g/dL (ref 30.0–36.0)
MCV: 83.7 fl (ref 78.0–100.0)
Monocytes Absolute: 0.5 10*3/uL (ref 0.1–1.0)
Monocytes Relative: 6.8 % (ref 3.0–12.0)
NEUTROS ABS: 3.8 10*3/uL (ref 1.4–7.7)
Neutrophils Relative %: 54.2 % (ref 43.0–77.0)
PLATELETS: 236 10*3/uL (ref 150.0–400.0)
RBC: 4.73 Mil/uL (ref 3.87–5.11)
RDW: 13.8 % (ref 11.5–15.5)
WBC: 6.9 10*3/uL (ref 4.0–10.5)

## 2014-10-13 LAB — HEPATIC FUNCTION PANEL
ALT: 11 U/L (ref 0–35)
AST: 24 U/L (ref 0–37)
Albumin: 3.9 g/dL (ref 3.5–5.2)
Alkaline Phosphatase: 70 U/L (ref 39–117)
BILIRUBIN DIRECT: 0.1 mg/dL (ref 0.0–0.3)
TOTAL PROTEIN: 7 g/dL (ref 6.0–8.3)
Total Bilirubin: 0.5 mg/dL (ref 0.2–1.2)

## 2014-10-13 LAB — POCT URINALYSIS DIPSTICK
Bilirubin, UA: NEGATIVE
GLUCOSE UA: NEGATIVE
KETONES UA: NEGATIVE
Leukocytes, UA: NEGATIVE
Nitrite, UA: NEGATIVE
Protein, UA: NEGATIVE
RBC UA: NEGATIVE
SPEC GRAV UA: 1.025
Urobilinogen, UA: 0.2
pH, UA: 6

## 2014-10-13 LAB — BASIC METABOLIC PANEL
BUN: 15 mg/dL (ref 6–23)
CALCIUM: 9.2 mg/dL (ref 8.4–10.5)
CHLORIDE: 102 meq/L (ref 96–112)
CO2: 28 meq/L (ref 19–32)
CREATININE: 0.7 mg/dL (ref 0.4–1.2)
GFR: 97.8 mL/min (ref >60.00)
Glucose, Bld: 95 mg/dL (ref 70–99)
Potassium: 3.8 mEq/L (ref 3.5–5.1)
SODIUM: 137 meq/L (ref 135–145)

## 2014-10-13 LAB — LIPID PANEL
Cholesterol: 190 mg/dL (ref 0–200)
HDL: 47.5 mg/dL (ref >39.00)
LDL Cholesterol: 117 mg/dL — ABNORMAL HIGH (ref 0–99)
NONHDL: 142.5
Total CHOL/HDL Ratio: 4
Triglycerides: 126 mg/dL (ref 0.0–149.0)
VLDL: 25.2 mg/dL (ref 0.0–40.0)

## 2014-10-13 MED ORDER — OLMESARTAN MEDOXOMIL-HCTZ 20-12.5 MG PO TABS: 1.0000 | ORAL_TABLET | Freq: Every day | ORAL | Status: DC

## 2014-10-13 MED ORDER — ESOMEPRAZOLE MAGNESIUM 40 MG PO CPDR: 40.0000 mg | DELAYED_RELEASE_CAPSULE | Freq: Every day | ORAL | Status: DC

## 2014-10-13 NOTE — Progress Notes (Signed)
Pre visit review using our clinic review tool, if applicable. No additional management support is needed unless otherwise documented below in the visit note. 

## 2014-10-13 NOTE — Progress Notes (Signed)
   Subjective:    Patient ID: Pamela Daugherty, female    DOB: 12-Jan-1951, 63 y.o.   MRN: [id number]  HPI 63 yr old female for a cpx. She feels well. She recently got over a viral URI.   Review of Systems  Constitutional: Negative.   HENT: Negative.   Eyes: Negative.   Respiratory: Negative.   Cardiovascular: Negative.   Gastrointestinal: Negative.   Genitourinary: Negative for dysuria, urgency, frequency, hematuria, flank pain, decreased urine volume, enuresis, difficulty urinating, pelvic pain and dyspareunia.  Musculoskeletal: Negative.   Skin: Negative.   Neurological: Negative.   Psychiatric/Behavioral: Negative.        Objective:   Physical Exam  Constitutional: She is oriented to person, place, and time. She appears well-developed and well-nourished. No distress.  HENT:  Head: Normocephalic and atraumatic.  Right Ear: External ear normal.  Left Ear: External ear normal.  Nose: Nose normal.  Mouth/Throat: Oropharynx is clear and moist. No oropharyngeal exudate.  Eyes: Conjunctivae and EOM are normal. Pupils are equal, round, and reactive to light. No scleral icterus.  Neck: Normal range of motion. Neck supple. No JVD present. No thyromegaly present.  Cardiovascular: Normal rate, regular rhythm, normal heart sounds and intact distal pulses.  Exam reveals no gallop and no friction rub.   No murmur heard. EKG normal   Pulmonary/Chest: Effort normal and breath sounds normal. No respiratory distress. She has no wheezes. She has no rales. She exhibits no tenderness.  Abdominal: Soft. Bowel sounds are normal. She exhibits no distension and no mass. There is no tenderness. There is no rebound and no guarding.  Musculoskeletal: Normal range of motion. She exhibits no edema or tenderness.  Lymphadenopathy:    She has no cervical adenopathy.  Neurological: She is alert and oriented to person, place, and time. She has normal reflexes. No cranial nerve deficit. She exhibits normal  muscle tone. Coordination normal.  Skin: Skin is warm and dry. No rash noted. No erythema.  Psychiatric: She has a normal mood and affect. Her behavior is normal. Judgment and thought content normal.          Assessment & Plan:  Well exam. Get fasting labs

## 2014-10-14 LAB — TSH: TSH: 2.68 u[IU]/mL (ref 0.35–4.50)

## 2015-01-15 ENCOUNTER — Telehealth: Payer: Self-pay | Admitting: Family Medicine

## 2015-01-15 MED ORDER — CIPROFLOXACIN HCL 500 MG PO TABS: 500.0000 mg | ORAL_TABLET | Freq: Two times a day (BID) | ORAL | Status: DC

## 2015-01-15 NOTE — Telephone Encounter (Signed)
Pt called to say that she think she has a UTI and is asking if something can be called in.   Pharmacy; [place]

## 2015-01-15 NOTE — Telephone Encounter (Signed)
Call in Cipro 500 mg bid for 7 days  

## 2015-01-15 NOTE — Telephone Encounter (Signed)
Rx sent to pharmacy. Called and spoke with pt and pt is aware.

## 2015-02-10 ENCOUNTER — Telehealth: Payer: Self-pay | Admitting: Family Medicine

## 2015-02-10 NOTE — Telephone Encounter (Signed)
Needs a refill on her Lexapro sent to [facility]. Her Rx has run out.

## 2015-02-10 NOTE — Telephone Encounter (Signed)
Refill this for one year  

## 2015-02-11 MED ORDER — ESCITALOPRAM OXALATE 20 MG PO TABS: 20.0000 mg | ORAL_TABLET | Freq: Every day | ORAL | Status: DC

## 2015-02-11 NOTE — Telephone Encounter (Signed)
I sent script e-scribe. 

## 2015-05-22 ENCOUNTER — Telehealth: Payer: Self-pay | Admitting: Family Medicine

## 2015-05-22 NOTE — Telephone Encounter (Signed)
Pt states she has an uti and would like abx call into [facility] in [place]. Pt decline sat appt.

## 2015-05-25 MED ORDER — CIPROFLOXACIN HCL 500 MG PO TABS: 500.0000 mg | ORAL_TABLET | Freq: Two times a day (BID) | ORAL | Status: DC

## 2015-05-25 NOTE — Telephone Encounter (Signed)
Call in Cipro 500 mg bid for 7 days, add AZO prn

## 2015-05-25 NOTE — Telephone Encounter (Signed)
I left a voice message for pt to return my call. I did not get this message until Monday morning 05/25/15.

## 2015-05-25 NOTE — Telephone Encounter (Signed)
I spoke with pt, has burning when she urinates and feels a lot of pressure on bladder, when she does go, it is only a little bit at a time. Can we call in something?

## 2015-05-25 NOTE — Telephone Encounter (Signed)
I sent script e-scribe and spoke with pt. 

## 2015-07-14 ENCOUNTER — Telehealth: Payer: Self-pay | Admitting: Family Medicine

## 2015-07-14 MED ORDER — CIPROFLOXACIN HCL 500 MG PO TABS: 500.0000 mg | ORAL_TABLET | Freq: Two times a day (BID) | ORAL | Status: DC

## 2015-07-14 NOTE — Telephone Encounter (Signed)
Call in Cipro 500 mg bid for 7 days  

## 2015-07-14 NOTE — Telephone Encounter (Signed)
Pt said she has a UTI and is asking if Dr Sarajane Jews will call her in something.   Pharmacy [facility]

## 2015-07-14 NOTE — Telephone Encounter (Signed)
[facility] and left a voice message for pt with this information.  

## 2015-08-06 LAB — HM MAMMOGRAPHY

## 2015-08-10 ENCOUNTER — Encounter: Payer: Self-pay | Admitting: Family Medicine

## 2015-08-14 LAB — HM MAMMOGRAPHY

## 2015-08-17 ENCOUNTER — Encounter: Payer: Self-pay | Admitting: Family Medicine

## 2015-08-24 ENCOUNTER — Encounter: Payer: Self-pay | Admitting: Family Medicine

## 2015-10-09 ENCOUNTER — Other Ambulatory Visit: Payer: Self-pay | Admitting: Family Medicine

## 2015-10-09 MED ORDER — OLMESARTAN MEDOXOMIL-HCTZ 20-12.5 MG PO TABS: 1.0000 | ORAL_TABLET | Freq: Every day | ORAL | Status: DC

## 2015-10-09 NOTE — Telephone Encounter (Signed)
Refill sent.

## 2015-10-09 NOTE — Telephone Encounter (Signed)
Pt request refill of the following: olmesartan-hydrochlorothiazide (BENICAR HCT) 20-12.5 MG per tablet   Phamacy: [facility]

## 2015-10-13 ENCOUNTER — Other Ambulatory Visit: Payer: Self-pay | Admitting: Family Medicine

## 2015-10-13 MED ORDER — OLMESARTAN MEDOXOMIL-HCTZ 20-12.5 MG PO TABS: 1.0000 | ORAL_TABLET | Freq: Every day | ORAL | Status: DC

## 2015-10-13 NOTE — Telephone Encounter (Signed)
Refill sent.

## 2015-10-13 NOTE — Telephone Encounter (Signed)
Pt request refill of the following:  olmesartan-hydrochlorothiazide (BENICAR HCT) 20-12.5 MG tablet   [facility]    Phamacy[facility]

## 2015-10-21 ENCOUNTER — Ambulatory Visit (INDEPENDENT_AMBULATORY_CARE_PROVIDER_SITE_OTHER): Admitting: Family Medicine

## 2015-10-21 ENCOUNTER — Encounter: Payer: Self-pay | Admitting: Family Medicine

## 2015-10-21 VITALS — BP 118/74 | HR 74 | Temp 99.0°F | Ht 67.75 in | Wt 181.0 lb

## 2015-10-21 DIAGNOSIS — Z23 Encounter for immunization: Secondary | ICD-10-CM | POA: Diagnosis not present

## 2015-10-21 DIAGNOSIS — E538 Deficiency of other specified B group vitamins: Secondary | ICD-10-CM | POA: Diagnosis not present

## 2015-10-21 DIAGNOSIS — Z0000 Encounter for general adult medical examination without abnormal findings: Secondary | ICD-10-CM

## 2015-10-21 DIAGNOSIS — K219 Gastro-esophageal reflux disease without esophagitis: Secondary | ICD-10-CM

## 2015-10-21 DIAGNOSIS — R739 Hyperglycemia, unspecified: Secondary | ICD-10-CM

## 2015-10-21 DIAGNOSIS — I10 Essential (primary) hypertension: Secondary | ICD-10-CM

## 2015-10-21 DIAGNOSIS — K209 Esophagitis, unspecified without bleeding: Secondary | ICD-10-CM

## 2015-10-21 LAB — CBC WITH DIFFERENTIAL/PLATELET
BASOS PCT: 0.4 % (ref 0.0–3.0)
Basophils Absolute: 0 10*3/uL (ref 0.0–0.1)
EOS ABS: 0.2 10*3/uL (ref 0.0–0.7)
Eosinophils Relative: 2.2 % (ref 0.0–5.0)
HEMATOCRIT: 40.8 % (ref 36.0–46.0)
Hemoglobin: 13.2 g/dL (ref 12.0–15.0)
LYMPHS ABS: 2.2 10*3/uL (ref 0.7–4.0)
LYMPHS PCT: 28.3 % (ref 12.0–46.0)
MCHC: 32.5 g/dL (ref 30.0–36.0)
MCV: 82.1 fl (ref 78.0–100.0)
MONO ABS: 0.5 10*3/uL (ref 0.1–1.0)
Monocytes Relative: 6.1 % (ref 3.0–12.0)
NEUTROS ABS: 5 10*3/uL (ref 1.4–7.7)
Neutrophils Relative %: 63 % (ref 43.0–77.0)
PLATELETS: 247 10*3/uL (ref 150.0–400.0)
RBC: 4.97 Mil/uL (ref 3.87–5.11)
RDW: 14.1 % (ref 11.5–15.5)
WBC: 7.9 10*3/uL (ref 4.0–10.5)

## 2015-10-21 LAB — LIPID PANEL
CHOLESTEROL: 201 mg/dL — AB (ref 0–200)
HDL: 57.5 mg/dL (ref >39.00)
LDL Cholesterol: 118 mg/dL — ABNORMAL HIGH (ref 0–99)
NonHDL: 143.67
Total CHOL/HDL Ratio: 3
Triglycerides: 127 mg/dL (ref 0.0–149.0)
VLDL: 25.4 mg/dL (ref 0.0–40.0)

## 2015-10-21 LAB — BASIC METABOLIC PANEL
BUN: 11 mg/dL (ref 6–23)
CO2: 27 meq/L (ref 19–32)
Calcium: 9.8 mg/dL (ref 8.4–10.5)
Chloride: 104 mEq/L (ref 96–112)
Creatinine, Ser: 0.75 mg/dL (ref 0.40–1.20)
GFR: 82.65 mL/min (ref >60.00)
GLUCOSE: 90 mg/dL (ref 70–99)
POTASSIUM: 4.5 meq/L (ref 3.5–5.1)
SODIUM: 141 meq/L (ref 135–145)

## 2015-10-21 LAB — POCT URINALYSIS DIPSTICK
BILIRUBIN UA: NEGATIVE
Glucose, UA: NEGATIVE
KETONES UA: NEGATIVE
Nitrite, UA: NEGATIVE
PH UA: 6.5
SPEC GRAV UA: 1.02
Urobilinogen, UA: 0.2

## 2015-10-21 LAB — HEPATIC FUNCTION PANEL
ALBUMIN: 4.2 g/dL (ref 3.5–5.2)
ALK PHOS: 74 U/L (ref 39–117)
ALT: 10 U/L (ref 0–35)
AST: 19 U/L (ref 0–37)
Bilirubin, Direct: 0.1 mg/dL (ref 0.0–0.3)
TOTAL PROTEIN: 6.8 g/dL (ref 6.0–8.3)
Total Bilirubin: 0.4 mg/dL (ref 0.2–1.2)

## 2015-10-21 LAB — VITAMIN B12: Vitamin B-12: 864 pg/mL (ref 211–911)

## 2015-10-21 LAB — TSH: TSH: 2.7 u[IU]/mL (ref 0.35–4.50)

## 2015-10-21 LAB — HEMOGLOBIN A1C: HEMOGLOBIN A1C: 5.7 % (ref 4.6–6.5)

## 2015-10-21 MED ORDER — MECLIZINE HCL 25 MG PO TABS: 25.0000 mg | ORAL_TABLET | ORAL | Status: DC | PRN

## 2015-10-21 MED ORDER — GABAPENTIN 100 MG PO CAPS: 100.0000 mg | ORAL_CAPSULE | Freq: Three times a day (TID) | ORAL | Status: DC

## 2015-10-21 MED ORDER — ESOMEPRAZOLE MAGNESIUM 40 MG PO CPDR: 40.0000 mg | DELAYED_RELEASE_CAPSULE | Freq: Every day | ORAL | Status: DC

## 2015-10-21 MED ORDER — OLMESARTAN MEDOXOMIL-HCTZ 20-12.5 MG PO TABS: 1.0000 | ORAL_TABLET | Freq: Every day | ORAL | Status: DC

## 2015-10-21 NOTE — Progress Notes (Signed)
Pre visit review using our clinic review tool, if applicable. No additional management support is needed unless otherwise documented below in the visit note. 

## 2015-10-21 NOTE — Progress Notes (Signed)
   Subjective:    Patient ID: Pamela Daugherty, female    DOB: 07/09/1951, 64 y.o.   MRN: [id number]  HPI 64 yr old female for a cpx. She feels well except for one problem. About one month ago she developed burning pains in both feet, and to a lesser extent in both hands. Sometimes this burning even wakes her up at night. She has had normal glucoses the past few years, but she has a stong family hx of diabetes. Also she took B12 shots for a while some years ago but her level has not been checked for years. She currently takes an OTC B12 supplement under the tongue daily.    Review of Systems  Constitutional: Negative.   HENT: Negative.   Eyes: Negative.   Respiratory: Negative.   Cardiovascular: Negative.   Gastrointestinal: Negative.   Genitourinary: Negative for dysuria, urgency, frequency, hematuria, flank pain, decreased urine volume, enuresis, difficulty urinating, pelvic pain and dyspareunia.  Musculoskeletal: Negative.   Skin: Negative.   Neurological: Negative.   Psychiatric/Behavioral: Negative.        Objective:   Physical Exam  Constitutional: She is oriented to person, place, and time. She appears well-developed and well-nourished. No distress.  HENT:  Head: Normocephalic and atraumatic.  Right Ear: External ear normal.  Left Ear: External ear normal.  Nose: Nose normal.  Mouth/Throat: Oropharynx is clear and moist. No oropharyngeal exudate.  Eyes: Conjunctivae and EOM are normal. Pupils are equal, round, and reactive to light. No scleral icterus.  Neck: Normal range of motion. Neck supple. No JVD present. No thyromegaly present.  Cardiovascular: Normal rate, regular rhythm, normal heart sounds and intact distal pulses.  Exam reveals no gallop and no friction rub.   No murmur heard. EKG normal   Pulmonary/Chest: Effort normal and breath sounds normal. No respiratory distress. She has no wheezes. She has no rales. She exhibits no tenderness.  Abdominal: Soft. Bowel  sounds are normal. She exhibits no distension and no mass. There is no tenderness. There is no rebound and no guarding.  Musculoskeletal: Normal range of motion. She exhibits no edema or tenderness.  Lymphadenopathy:    She has no cervical adenopathy.  Neurological: She is alert and oriented to person, place, and time. She has normal reflexes. No cranial nerve deficit. She exhibits normal muscle tone. Coordination normal.  Skin: Skin is warm and dry. No rash noted. No erythema.  Psychiatric: She has a normal mood and affect. Her behavior is normal. Judgment and thought content normal.          Assessment & Plan:  Well exam. We discussed diet and exercise. Get fasting labs. She has neuropathy so she will try Gabapentin 100 mg tid. Check an A1c and a B12 level today.

## 2015-10-22 ENCOUNTER — Other Ambulatory Visit: Payer: Self-pay | Admitting: Family Medicine

## 2015-10-22 ENCOUNTER — Telehealth: Payer: Self-pay | Admitting: Family Medicine

## 2015-10-22 MED ORDER — CIPROFLOXACIN HCL 500 MG PO TABS: 500.0000 mg | ORAL_TABLET | Freq: Two times a day (BID) | ORAL | Status: DC

## 2015-10-22 NOTE — Telephone Encounter (Signed)
Pt request refill of the following: ciprofloxacin (CIPRO) 500 MG tablet   rx was sent to [facility] need to be sent to   [facility]: [facility] [place]

## 2015-10-22 NOTE — Addendum Note (Signed)
Addended by: Ailene Rud E on: 10/22/2015 11:28 AM   Modules accepted: Orders

## 2015-10-22 NOTE — Telephone Encounter (Signed)
[facility] 

## 2015-11-18 ENCOUNTER — Other Ambulatory Visit: Payer: Self-pay | Admitting: Family Medicine

## 2015-11-18 NOTE — Telephone Encounter (Signed)
Denied.  SENT TO [facility] IN 09/2015.

## 2015-11-19 ENCOUNTER — Other Ambulatory Visit: Payer: Self-pay | Admitting: Family Medicine

## 2015-11-19 ENCOUNTER — Encounter: Payer: Self-pay | Admitting: Family Medicine

## 2015-11-24 ENCOUNTER — Other Ambulatory Visit: Payer: Self-pay | Admitting: Family Medicine

## 2015-11-26 MED ORDER — GABAPENTIN 100 MG PO CAPS: 100.0000 mg | ORAL_CAPSULE | Freq: Three times a day (TID) | ORAL | Status: DC

## 2015-11-26 NOTE — Addendum Note (Signed)
Addended by: Aggie Hacker A on: 11/26/2015 12:17 PM   Modules accepted: Orders

## 2016-02-11 ENCOUNTER — Other Ambulatory Visit: Payer: Self-pay | Admitting: Family Medicine

## 2016-02-11 NOTE — Telephone Encounter (Signed)
Pt needs refill on lexapro 20 mg #90 w/refills send to [facility]

## 2016-02-12 MED ORDER — ESCITALOPRAM OXALATE 20 MG PO TABS: 20.0000 mg | ORAL_TABLET | Freq: Every day | ORAL | Status: DC

## 2016-02-12 NOTE — Telephone Encounter (Signed)
Rx has been sent  

## 2016-02-12 NOTE — Telephone Encounter (Signed)
Refill for one year 

## 2016-02-16 LAB — HM MAMMOGRAPHY

## 2016-02-19 ENCOUNTER — Encounter: Payer: Self-pay | Admitting: Family Medicine

## 2016-03-18 ENCOUNTER — Other Ambulatory Visit: Payer: Self-pay | Admitting: Family Medicine

## 2016-03-18 NOTE — Telephone Encounter (Signed)
What is this for

## 2016-03-19 ENCOUNTER — Other Ambulatory Visit: Payer: Self-pay | Admitting: Family Medicine

## 2016-03-22 ENCOUNTER — Telehealth: Payer: Self-pay | Admitting: General Practice

## 2016-03-22 MED ORDER — CIPROFLOXACIN HCL 500 MG PO TABS: 500.0000 mg | ORAL_TABLET | Freq: Two times a day (BID) | ORAL | Status: DC

## 2016-03-22 NOTE — Telephone Encounter (Signed)
I sent script e-scribe and left a voice message for pt with this information.  

## 2016-03-22 NOTE — Telephone Encounter (Signed)
Call in Cipro 500 mg bid for 7 days  

## 2016-08-25 ENCOUNTER — Encounter: Payer: Self-pay | Admitting: Family Medicine

## 2016-08-25 DIAGNOSIS — N6323 Unspecified lump in the left breast, lower outer quadrant: Secondary | ICD-10-CM | POA: Diagnosis not present

## 2016-08-25 DIAGNOSIS — N6320 Unspecified lump in the left breast, unspecified quadrant: Secondary | ICD-10-CM | POA: Diagnosis not present

## 2016-08-25 DIAGNOSIS — R928 Other abnormal and inconclusive findings on diagnostic imaging of breast: Secondary | ICD-10-CM | POA: Diagnosis not present

## 2016-08-29 ENCOUNTER — Encounter: Payer: Self-pay | Admitting: Family Medicine

## 2016-08-29 ENCOUNTER — Other Ambulatory Visit: Payer: Self-pay | Admitting: Radiology

## 2016-08-29 DIAGNOSIS — N6012 Diffuse cystic mastopathy of left breast: Secondary | ICD-10-CM | POA: Diagnosis not present

## 2016-08-29 DIAGNOSIS — N6321 Unspecified lump in the left breast, upper outer quadrant: Secondary | ICD-10-CM | POA: Diagnosis not present

## 2016-10-19 ENCOUNTER — Encounter: Payer: Self-pay | Admitting: Family Medicine

## 2016-10-19 ENCOUNTER — Ambulatory Visit (INDEPENDENT_AMBULATORY_CARE_PROVIDER_SITE_OTHER): Payer: Medicare Other | Admitting: Family Medicine

## 2016-10-19 VITALS — BP 120/70 | HR 78 | Temp 97.9°F | Resp 12 | Ht 67.75 in | Wt 179.5 lb

## 2016-10-19 DIAGNOSIS — J069 Acute upper respiratory infection, unspecified: Secondary | ICD-10-CM

## 2016-10-19 DIAGNOSIS — R197 Diarrhea, unspecified: Secondary | ICD-10-CM

## 2016-10-19 LAB — POCT INFLUENZA A/B
INFLUENZA A, POC: NEGATIVE
INFLUENZA B, POC: NEGATIVE

## 2016-10-19 MED ORDER — BENZONATATE 100 MG PO CAPS: 200.0000 mg | ORAL_CAPSULE | Freq: Two times a day (BID) | ORAL | 0 refills | Status: AC | PRN

## 2016-10-19 MED ORDER — FLUTICASONE PROPIONATE 50 MCG/ACT NA SUSP: 1.0000 | Freq: Two times a day (BID) | NASAL | 0 refills | Status: DC

## 2016-10-19 NOTE — Progress Notes (Signed)
HPI:  ACUTE VISIT:  Chief Complaint  Patient presents with  . Sore Throat    [name] is a 65 y.o. female, who is here today complaining of 4 days of respiratory symptoms.  Mostly nonproductive cough for the past couple days, occasionally she feels like she is producing sputum but cannot bring it up + Frontal pressure headache, denies visual changes.   + Nasal congestion, rhinorrhea, sore throat, and today noted post nasal drainage. Denies chills or fever. She had mild body aches initially.  + Diarrhea, 3 soft stools yesterday, none today. She denies nausea or vomiting. Diffuse "stomachache", intermittently, mild, has not had it today. Denies urinary symptoms.  No Hx of recent travel. Sick contact: None before illness started, grandson was sick 10/17/16, Dx with flu.  No Hx of allergies.  Medication OTC for this problem: Mucinex, Termaflu, and Tylenol sinus and cold.  Symptoms otherwise stable.    Review of Systems  Constitutional: Positive for appetite change and fatigue. Negative for fever.  HENT: Positive for congestion, postnasal drip, rhinorrhea, sinus pressure and sore throat. Negative for ear pain, mouth sores, trouble swallowing and voice change.   Eyes: Negative for discharge, redness, itching and visual disturbance.  Respiratory: Positive for cough. Negative for shortness of breath and wheezing.   Gastrointestinal: Positive for diarrhea. Negative for abdominal distention, blood in stool, nausea and vomiting.  Genitourinary: Negative for decreased urine volume, dysuria and hematuria.  Musculoskeletal: Positive for myalgias.  Skin: Negative for rash.  Allergic/Immunologic: Negative for environmental allergies.  Neurological: Positive for headaches (frontal pressure). Negative for syncope and weakness.  Hematological: Negative for adenopathy. Does not bruise/bleed easily.  Psychiatric/Behavioral: Negative for confusion.      Current Outpatient  Prescriptions on File Prior to Visit  Medication Sig Dispense Refill  . ALPRAZolam (XANAX) 0.5 MG tablet Take 0.25 mg by mouth at bedtime as needed.    Marland Kitchen aspirin 81 MG EC tablet Take 81 mg by mouth daily.      Marland Kitchen escitalopram (LEXAPRO) 20 MG tablet Take 1 tablet (20 mg total) by mouth daily. 90 tablet 3  . esomeprazole (NEXIUM) 40 MG capsule Take 1 capsule (40 mg total) by mouth daily before breakfast. 90 capsule 3  . fish oil-omega-3 fatty acids 1000 MG capsule Take 1 g by mouth daily.      Marland Kitchen gabapentin (NEURONTIN) 100 MG capsule Take 1 capsule (100 mg total) by mouth 3 (three) times daily. 270 capsule 3  . Linaclotide (LINZESS) 145 MCG CAPS Take 145 mcg by mouth daily as needed.    . meclizine (ANTIVERT) 25 MG tablet Take 1 tablet (25 mg total) by mouth every 4 (four) hours as needed for dizziness. 90 tablet 3  . olmesartan-hydrochlorothiazide (BENICAR HCT) 20-12.5 MG tablet Take 1 tablet by mouth daily. 90 tablet 3  . vitamin B-12 (CYANOCOBALAMIN) 1000 MCG tablet Inject 1,000 mcg as directed daily.     . vitamin E 600 UNIT capsule Take 600 Units by mouth daily.       No current facility-administered medications on file prior to visit.      Past Medical History:  Diagnosis Date  . Anxiety   . B12 deficiency   . Depression   . Esophageal reflux   . Esophageal stricture   . Fatty liver   . Hypertension   . IBS (irritable bowel syndrome)   . Insomnia   . Neck pain   . Routine gynecological examination    sees  Dr. Alden Hipp    Allergies  Allergen Reactions  . Penicillins     rash    Social History   Social History  . Marital status: Married    Spouse name: N/A  . Number of children: N/A  . Years of education: N/A   Occupational History  . adjuster    Social History Main Topics  . Smoking status: Never Smoker  . Smokeless tobacco: Never Used  . Alcohol use 1.2 oz/week    2 Standard drinks or equivalent per week  . Drug use: No  . Sexual activity: Not Asked    Other Topics Concern  . None   Social History Narrative  . None    Vitals:   10/19/16 1032  BP: 120/70  Pulse: 78  Resp: 12  Temp: 97.9 F (36.6 C)   O2 sat 98% at RA.   Body mass index is 27.49 kg/m.    Physical Exam  Nursing note and vitals reviewed. Constitutional: She is oriented to person, place, and time. She appears well-developed. She does not appear ill. No distress.  HENT:  Head: Atraumatic.  Right Ear: Tympanic membrane, external ear and ear canal normal.  Left Ear: Tympanic membrane, external ear and ear canal normal.  Nose: Rhinorrhea present. Right sinus exhibits no maxillary sinus tenderness and no frontal sinus tenderness. Left sinus exhibits no maxillary sinus tenderness and no frontal sinus tenderness.  Mouth/Throat: Uvula is midline and mucous membranes are normal. Posterior oropharyngeal erythema present. No oropharyngeal exudate or posterior oropharyngeal edema.  Clear post nasal drainage.  Eyes: Conjunctivae are normal.  Neck: No muscular tenderness present.  Cardiovascular: Normal rate and regular rhythm.   Respiratory: Effort normal and breath sounds normal. No stridor. No respiratory distress.  Lymphadenopathy:       Head (right side): No submandibular adenopathy present.       Head (left side): No submandibular adenopathy present.    She has no cervical adenopathy.  Neurological: She is alert and oriented to person, place, and time. She has normal strength.  Skin: Skin is warm. No rash noted. No erythema.  Psychiatric: She has a normal mood and affect. Her speech is normal.  Well groomed, good eye contact.      ASSESSMENT AND PLAN:     Azhar was seen today for sore throat.  Diagnoses and all orders for this visit:    URI, acute  Symptoms suggests a viral etiology, I explained patient that symptomatic treatment is usually recommended in this case, so I do not think abx is needed at this time. Rapid flu was done today because  her daughter is pregnant and was with her Christmas day, negative.  Instructed to monitor for signs of complications, including new onset of fever among some, clearly instructed about warning signs.  I also explained that cough and nasal congestion can last a few days and sometimes weeks. F/U as needed.  -     fluticasone (FLONASE) 50 MCG/ACT nasal spray; Place 1 spray into both nostrils 2 (two) times daily. -     benzonatate (TESSALON) 100 MG capsule; Take 2 capsules (200 mg total) by mouth 2 (two) times daily as needed. -     POCT Influenza A/B  Diarrhea, unspecified type  Adequate hydration, ideally Gatorade or Pedialyte. Instructed about warning signs.      Return if symptoms worsen or fail to improve, for PCP.     -[name] was advised to return or notify a  doctor immediately if symptoms worsen or persist or new concerns arise. She has an appt for her routine physical 10/21/16, she was instructed to keep it.      Angelis Gates G. [place], MD  [facility]. [place] office.

## 2016-10-19 NOTE — Progress Notes (Signed)
Pre visit review using our clinic review tool, if applicable. No additional management support is needed unless otherwise documented below in the visit note. 

## 2016-10-19 NOTE — Patient Instructions (Addendum)
  [name] I have seen you today for an acute visit.  A few things to remember from today's visit:   URI, acute  Diarrhea, unspecified type  Flu test negative.   viral infections are self-limited and we treat each symptom depending of severity.   Over the counter medications as decongestants and cold medications usually help, they need to be taken with caution if there is a history of high blood pressure or palpitations. Tylenol and/or Ibuprofen also helps with most symptoms (headache, muscle aching, fever,etc) Plenty of fluids, ideally Gatorade or Pedialyte because diarrhea.   Honey helps with cough. Steam inhalations helps with runny nose, nasal congestion, and may prevent sinus infections. Cough and nasal congestion could last a few days and sometimes weeks. Please follow in not any better in 1-2 weeks or if symptoms get worse.  Please be sure medication list is accurate. If a new problem present, please set up appointment sooner than planned today.

## 2016-10-21 ENCOUNTER — Encounter: Payer: Self-pay | Admitting: Family Medicine

## 2016-11-09 ENCOUNTER — Encounter: Payer: Self-pay | Admitting: Family Medicine

## 2016-11-16 ENCOUNTER — Telehealth: Payer: Self-pay | Admitting: Family Medicine

## 2016-11-16 DIAGNOSIS — K209 Esophagitis, unspecified without bleeding: Secondary | ICD-10-CM

## 2016-11-16 DIAGNOSIS — K219 Gastro-esophageal reflux disease without esophagitis: Secondary | ICD-10-CM

## 2016-11-16 NOTE — Telephone Encounter (Signed)
Pt needs refill on esomeprazole 40 mg #90 w/refills send to [facility]

## 2016-11-18 MED ORDER — ESOMEPRAZOLE MAGNESIUM 40 MG PO CPDR: 40.0000 mg | DELAYED_RELEASE_CAPSULE | Freq: Every day | ORAL | 0 refills | Status: DC

## 2016-11-18 NOTE — Telephone Encounter (Signed)
I sent script e-scribe to below pharmacy.  

## 2016-11-29 ENCOUNTER — Ambulatory Visit (INDEPENDENT_AMBULATORY_CARE_PROVIDER_SITE_OTHER): Payer: Medicare Other | Admitting: Family Medicine

## 2016-11-29 ENCOUNTER — Encounter: Payer: Self-pay | Admitting: Family Medicine

## 2016-11-29 VITALS — BP 134/88 | HR 74 | Temp 98.1°F | Ht 67.75 in | Wt 175.0 lb

## 2016-11-29 DIAGNOSIS — E538 Deficiency of other specified B group vitamins: Secondary | ICD-10-CM

## 2016-11-29 DIAGNOSIS — K219 Gastro-esophageal reflux disease without esophagitis: Secondary | ICD-10-CM | POA: Diagnosis not present

## 2016-11-29 DIAGNOSIS — F418 Other specified anxiety disorders: Secondary | ICD-10-CM | POA: Diagnosis not present

## 2016-11-29 DIAGNOSIS — I10 Essential (primary) hypertension: Secondary | ICD-10-CM

## 2016-11-29 DIAGNOSIS — Z23 Encounter for immunization: Secondary | ICD-10-CM

## 2016-11-29 DIAGNOSIS — D508 Other iron deficiency anemias: Secondary | ICD-10-CM | POA: Diagnosis not present

## 2016-11-29 DIAGNOSIS — K209 Esophagitis, unspecified without bleeding: Secondary | ICD-10-CM

## 2016-11-29 LAB — CBC WITH DIFFERENTIAL/PLATELET
BASOS ABS: 0 10*3/uL (ref 0.0–0.1)
Basophils Relative: 0.7 % (ref 0.0–3.0)
EOS PCT: 2.7 % (ref 0.0–5.0)
Eosinophils Absolute: 0.2 10*3/uL (ref 0.0–0.7)
HCT: 37 % (ref 36.0–46.0)
HEMOGLOBIN: 11.9 g/dL — AB (ref 12.0–15.0)
LYMPHS ABS: 2.4 10*3/uL (ref 0.7–4.0)
Lymphocytes Relative: 38 % (ref 12.0–46.0)
MCHC: 32.2 g/dL (ref 30.0–36.0)
MCV: 76.2 fl — ABNORMAL LOW (ref 78.0–100.0)
MONO ABS: 0.4 10*3/uL (ref 0.1–1.0)
Monocytes Relative: 5.8 % (ref 3.0–12.0)
NEUTROS PCT: 52.8 % (ref 43.0–77.0)
Neutro Abs: 3.4 10*3/uL (ref 1.4–7.7)
Platelets: 272 10*3/uL (ref 150.0–400.0)
RBC: 4.85 Mil/uL (ref 3.87–5.11)
RDW: 15.7 % — ABNORMAL HIGH (ref 11.5–15.5)
WBC: 6.4 10*3/uL (ref 4.0–10.5)

## 2016-11-29 LAB — POC URINALSYSI DIPSTICK (AUTOMATED)
Bilirubin, UA: NEGATIVE
Blood, UA: NEGATIVE
Glucose, UA: NEGATIVE
Leukocytes, UA: NEGATIVE
Nitrite, UA: NEGATIVE
PH UA: 5.5
PROTEIN UA: NEGATIVE
UROBILINOGEN UA: 0.2

## 2016-11-29 LAB — BASIC METABOLIC PANEL
BUN: 14 mg/dL (ref 6–23)
CALCIUM: 9.3 mg/dL (ref 8.4–10.5)
CO2: 30 mEq/L (ref 19–32)
CREATININE: 0.67 mg/dL (ref 0.40–1.20)
Chloride: 103 mEq/L (ref 96–112)
GFR: 93.81 mL/min (ref >60.00)
Glucose, Bld: 89 mg/dL (ref 70–99)
Potassium: 4.1 mEq/L (ref 3.5–5.1)
Sodium: 139 mEq/L (ref 135–145)

## 2016-11-29 LAB — TSH: TSH: 1.46 u[IU]/mL (ref 0.35–4.50)

## 2016-11-29 LAB — LIPID PANEL
CHOL/HDL RATIO: 4
Cholesterol: 180 mg/dL (ref 0–200)
HDL: 47.1 mg/dL (ref >39.00)
LDL CALC: 103 mg/dL — AB (ref 0–99)
NONHDL: 132.97
TRIGLYCERIDES: 148 mg/dL (ref 0.0–149.0)
VLDL: 29.6 mg/dL (ref 0.0–40.0)

## 2016-11-29 LAB — HEPATIC FUNCTION PANEL
ALT: 10 U/L (ref 0–35)
AST: 17 U/L (ref 0–37)
Albumin: 4.3 g/dL (ref 3.5–5.2)
Alkaline Phosphatase: 66 U/L (ref 39–117)
BILIRUBIN DIRECT: 0.1 mg/dL (ref 0.0–0.3)
BILIRUBIN TOTAL: 0.4 mg/dL (ref 0.2–1.2)
TOTAL PROTEIN: 6.8 g/dL (ref 6.0–8.3)

## 2016-11-29 LAB — VITAMIN B12: VITAMIN B 12: 445 pg/mL (ref 211–911)

## 2016-11-29 MED ORDER — OLMESARTAN MEDOXOMIL-HCTZ 20-12.5 MG PO TABS: 1.0000 | ORAL_TABLET | Freq: Every day | ORAL | 3 refills | Status: DC

## 2016-11-29 MED ORDER — ESOMEPRAZOLE MAGNESIUM 40 MG PO CPDR: 40.0000 mg | DELAYED_RELEASE_CAPSULE | Freq: Every day | ORAL | 3 refills | Status: DC

## 2016-11-29 MED ORDER — ESCITALOPRAM OXALATE 20 MG PO TABS: 20.0000 mg | ORAL_TABLET | Freq: Every day | ORAL | 3 refills | Status: DC

## 2016-11-29 NOTE — Progress Notes (Signed)
Pre visit review using our clinic review tool, if applicable. No additional management support is needed unless otherwise documented below in the visit note. 

## 2016-11-29 NOTE — Progress Notes (Signed)
   Subjective:    Patient ID: Pamela Daugherty, female    DOB: 05-27-1951, 66 y.o.   MRN: [id number]  HPI 66 yr old female to follow up on issues including BP. This has been well controlled. Her depression and anxiety are stable. She recently started working on a part time job and this has been good for her since it gets her out of the house and around other people. She still takes a B12 tablet every day. Her GERD is stable.    Review of Systems  Constitutional: Negative.   HENT: Negative.   Eyes: Negative.   Respiratory: Negative.   Cardiovascular: Negative.   Gastrointestinal: Negative.   Genitourinary: Negative for decreased urine volume, difficulty urinating, dyspareunia, dysuria, enuresis, flank pain, frequency, hematuria, pelvic pain and urgency.  Musculoskeletal: Negative.   Skin: Negative.   Neurological: Negative.   Psychiatric/Behavioral: Negative.        Objective:   Physical Exam  Constitutional: She is oriented to person, place, and time. She appears well-developed and well-nourished. No distress.  HENT:  Head: Normocephalic and atraumatic.  Right Ear: External ear normal.  Left Ear: External ear normal.  Nose: Nose normal.  Mouth/Throat: Oropharynx is clear and moist. No oropharyngeal exudate.  Eyes: Conjunctivae and EOM are normal. Pupils are equal, round, and reactive to light. No scleral icterus.  Neck: Normal range of motion. Neck supple. No JVD present. No thyromegaly present.  Cardiovascular: Normal rate, regular rhythm, normal heart sounds and intact distal pulses.  Exam reveals no gallop and no friction rub.   No murmur heard. Pulmonary/Chest: Effort normal and breath sounds normal. No respiratory distress. She has no wheezes. She has no rales. She exhibits no tenderness.  Abdominal: Soft. Bowel sounds are normal. She exhibits no distension and no mass. There is no tenderness. There is no rebound and no guarding.  Musculoskeletal: Normal range of motion. She  exhibits no edema or tenderness.  Lymphadenopathy:    She has no cervical adenopathy.  Neurological: She is alert and oriented to person, place, and time. She has normal reflexes. No cranial nerve deficit. She exhibits normal muscle tone. Coordination normal.  Skin: Skin is warm and dry. No rash noted. No erythema.  Psychiatric: She has a normal mood and affect. Her behavior is normal. Judgment and thought content normal.          Assessment & Plan:  Her HTN is stable. Her depression and anxiety are stable. Her GERD is stable. We will get fasting labs today including a B12 level. Her IBS is stable.  Alysia Penna, MD

## 2016-12-22 ENCOUNTER — Encounter: Payer: Self-pay | Admitting: Family Medicine

## 2016-12-22 DIAGNOSIS — N6002 Solitary cyst of left breast: Secondary | ICD-10-CM | POA: Diagnosis not present

## 2016-12-29 ENCOUNTER — Other Ambulatory Visit: Payer: Self-pay | Admitting: Family Medicine

## 2016-12-30 NOTE — Telephone Encounter (Signed)
She needs another OV to re-evaluate

## 2017-03-28 ENCOUNTER — Telehealth: Payer: Self-pay | Admitting: Family Medicine

## 2017-03-28 NOTE — Telephone Encounter (Signed)
° ° ° °  Pt request refill of the following:  gabapentin (NEURONTIN) 100 MG capsule   Phamacy:  [facility]

## 2017-03-29 MED ORDER — GABAPENTIN 100 MG PO CAPS: 100.0000 mg | ORAL_CAPSULE | Freq: Three times a day (TID) | ORAL | 3 refills | Status: DC

## 2017-03-29 NOTE — Telephone Encounter (Signed)
I sent script e-scribe to [facility].

## 2017-05-23 ENCOUNTER — Encounter: Payer: Self-pay | Admitting: Family Medicine

## 2017-05-30 ENCOUNTER — Ambulatory Visit (INDEPENDENT_AMBULATORY_CARE_PROVIDER_SITE_OTHER): Payer: Medicare Other | Admitting: Family Medicine

## 2017-05-30 ENCOUNTER — Encounter: Payer: Self-pay | Admitting: Family Medicine

## 2017-05-30 VITALS — BP 118/75 | HR 72 | Temp 98.3°F | Ht 67.75 in | Wt 185.0 lb

## 2017-05-30 DIAGNOSIS — N390 Urinary tract infection, site not specified: Secondary | ICD-10-CM | POA: Diagnosis not present

## 2017-05-30 DIAGNOSIS — N949 Unspecified condition associated with female genital organs and menstrual cycle: Secondary | ICD-10-CM

## 2017-05-30 LAB — POC URINALSYSI DIPSTICK (AUTOMATED)
BILIRUBIN UA: NEGATIVE
Glucose, UA: NEGATIVE
KETONES UA: NEGATIVE
Leukocytes, UA: NEGATIVE
Nitrite, UA: NEGATIVE
PH UA: 6 (ref 5.0–8.0)
PROTEIN UA: NEGATIVE
RBC UA: NEGATIVE
SPEC GRAV UA: 1.015 (ref 1.010–1.025)
Urobilinogen, UA: 0.2 E.U./dL

## 2017-05-30 MED ORDER — NITROFURANTOIN MONOHYD MACRO 100 MG PO CAPS: 100.0000 mg | ORAL_CAPSULE | Freq: Two times a day (BID) | ORAL | 0 refills | Status: DC

## 2017-05-30 NOTE — Progress Notes (Signed)
   Subjective:    Patient ID: Pamela Daugherty, female    DOB: 1951/02/03, 66 y.o.   MRN: [id number]  HPI Here for 2 weeks of burning and itching in the perineal area. No vaginal DC or odor has been noted. She has been applying Vagisil to the area. Then 2 days ago she developed a dull left sided lower back pain and nausea without vomiting. No fever. She also has burning on urination, along with urinary urgency and frequency. She drinks plenty of water.    Review of Systems  Constitutional: Negative.   Respiratory: Negative.   Cardiovascular: Negative.   Gastrointestinal: Negative.   Genitourinary: Positive for dysuria, frequency and urgency. Negative for hematuria, vaginal bleeding and vaginal discharge.       Objective:   Physical Exam  Constitutional: She appears well-developed and well-nourished.  Cardiovascular: Normal rate, regular rhythm, normal heart sounds and intact distal pulses.   Pulmonary/Chest: Effort normal and breath sounds normal. No respiratory distress. She has no wheezes. She has no rales.  Abdominal: Soft. Bowel sounds are normal. She exhibits no distension. There is no tenderness. There is no rebound and no guarding.          Assessment & Plan:  Probable UTI. Treat with Macrobid. Use Vagisil prn.  Alysia Penna, MD

## 2017-05-30 NOTE — Patient Instructions (Signed)
WE NOW OFFER   [facility] FAST TRACK!!!  SAME DAY Appointments for ACUTE CARE  Such as: Sprains, Injuries, cuts, abrasions, rashes, muscle pain, joint pain, back pain Colds, flu, sore throats, headache, allergies, cough, fever  Ear pain, sinus and eye infections Abdominal pain, nausea, vomiting, diarrhea, upset stomach Animal/insect bites  3 Easy Ways to Schedule: Walk-In Scheduling Call in scheduling Mychart Sign-up: [email or web address]         

## 2017-07-13 ENCOUNTER — Encounter: Payer: Self-pay | Admitting: Family Medicine

## 2017-08-23 DIAGNOSIS — Z124 Encounter for screening for malignant neoplasm of cervix: Secondary | ICD-10-CM | POA: Diagnosis not present

## 2017-08-23 DIAGNOSIS — N909 Noninflammatory disorder of vulva and perineum, unspecified: Secondary | ICD-10-CM | POA: Diagnosis not present

## 2017-08-23 DIAGNOSIS — N901 Moderate vulvar dysplasia: Secondary | ICD-10-CM | POA: Diagnosis not present

## 2017-09-01 ENCOUNTER — Encounter: Payer: Self-pay | Admitting: Family Medicine

## 2017-09-01 DIAGNOSIS — Z09 Encounter for follow-up examination after completed treatment for conditions other than malignant neoplasm: Secondary | ICD-10-CM | POA: Diagnosis not present

## 2017-09-01 DIAGNOSIS — R928 Other abnormal and inconclusive findings on diagnostic imaging of breast: Secondary | ICD-10-CM | POA: Diagnosis not present

## 2017-12-20 ENCOUNTER — Encounter: Payer: Self-pay | Admitting: Family Medicine

## 2017-12-20 ENCOUNTER — Telehealth: Payer: Self-pay | Admitting: Family Medicine

## 2017-12-20 DIAGNOSIS — K209 Esophagitis, unspecified without bleeding: Secondary | ICD-10-CM

## 2017-12-20 DIAGNOSIS — K219 Gastro-esophageal reflux disease without esophagitis: Secondary | ICD-10-CM

## 2017-12-20 NOTE — Telephone Encounter (Signed)
Copied from [place] [id number]. Topic: Quick Communication - Rx Refill/Question >> Dec 20, 2017  1:12 PM Robina Ade, Helene Kelp D wrote: Medication: esomeprazole (NEXIUM) 40 MG capsule   Has the patient contacted their pharmacy? Yes   (Agent: If no, request that the patient contact the pharmacy for the refill.)   Preferred Pharmacy (with phone number or street name): [place], [place]   Agent: Please be advised that RX refills may take up to 3 business days. We ask that you follow-up with your pharmacy.

## 2017-12-21 MED ORDER — ESOMEPRAZOLE MAGNESIUM 40 MG PO CPDR: 40.0000 mg | DELAYED_RELEASE_CAPSULE | Freq: Every day | ORAL | 0 refills | Status: DC

## 2017-12-21 NOTE — Telephone Encounter (Signed)
Contacted pt regarding refill request and the need for physical exam; pt scheduled appointment with Dr Chalmers Guest at [facility] 12/26/17 at 0800; refill also sent to cover pt until appointment.

## 2017-12-26 ENCOUNTER — Ambulatory Visit (INDEPENDENT_AMBULATORY_CARE_PROVIDER_SITE_OTHER): Payer: Medicare Other | Admitting: Family Medicine

## 2017-12-26 ENCOUNTER — Encounter: Payer: Self-pay | Admitting: Family Medicine

## 2017-12-26 VITALS — BP 116/78 | HR 73 | Temp 97.7°F | Ht 67.25 in | Wt 185.6 lb

## 2017-12-26 DIAGNOSIS — K219 Gastro-esophageal reflux disease without esophagitis: Secondary | ICD-10-CM

## 2017-12-26 DIAGNOSIS — F418 Other specified anxiety disorders: Secondary | ICD-10-CM

## 2017-12-26 DIAGNOSIS — Z23 Encounter for immunization: Secondary | ICD-10-CM

## 2017-12-26 DIAGNOSIS — D509 Iron deficiency anemia, unspecified: Secondary | ICD-10-CM

## 2017-12-26 DIAGNOSIS — I10 Essential (primary) hypertension: Secondary | ICD-10-CM

## 2017-12-26 DIAGNOSIS — K581 Irritable bowel syndrome with constipation: Secondary | ICD-10-CM

## 2017-12-26 LAB — BASIC METABOLIC PANEL
BUN: 13 mg/dL (ref 6–23)
CHLORIDE: 103 meq/L (ref 96–112)
CO2: 28 mEq/L (ref 19–32)
Calcium: 9.7 mg/dL (ref 8.4–10.5)
Creatinine, Ser: 0.74 mg/dL (ref 0.40–1.20)
GFR: 83.37 mL/min (ref >60.00)
Glucose, Bld: 95 mg/dL (ref 70–99)
POTASSIUM: 4.2 meq/L (ref 3.5–5.1)
Sodium: 138 mEq/L (ref 135–145)

## 2017-12-26 LAB — HEPATIC FUNCTION PANEL
ALT: 7 U/L (ref 0–35)
AST: 15 U/L (ref 0–37)
Albumin: 4.1 g/dL (ref 3.5–5.2)
Alkaline Phosphatase: 71 U/L (ref 39–117)
BILIRUBIN TOTAL: 0.4 mg/dL (ref 0.2–1.2)
Bilirubin, Direct: 0.1 mg/dL (ref 0.0–0.3)
Total Protein: 6.7 g/dL (ref 6.0–8.3)

## 2017-12-26 LAB — POC URINALSYSI DIPSTICK (AUTOMATED)
Bilirubin, UA: NEGATIVE
Blood, UA: NEGATIVE
Glucose, UA: NEGATIVE
KETONES UA: NEGATIVE
LEUKOCYTES UA: NEGATIVE
Nitrite, UA: NEGATIVE
PROTEIN UA: NEGATIVE
Spec Grav, UA: >=1.03 — AB (ref 1.010–1.025)
Urobilinogen, UA: 0.2 E.U./dL
pH, UA: 5.5 (ref 5.0–8.0)

## 2017-12-26 LAB — LIPID PANEL
CHOL/HDL RATIO: 4
Cholesterol: 180 mg/dL (ref 0–200)
HDL: 50.7 mg/dL (ref >39.00)
LDL Cholesterol: 99 mg/dL (ref 0–99)
NONHDL: 129
Triglycerides: 152 mg/dL — ABNORMAL HIGH (ref 0.0–149.0)
VLDL: 30.4 mg/dL (ref 0.0–40.0)

## 2017-12-26 LAB — CBC WITH DIFFERENTIAL/PLATELET
Basophils Absolute: 0 10*3/uL (ref 0.0–0.1)
Basophils Relative: 0.7 % (ref 0.0–3.0)
EOS PCT: 3.4 % (ref 0.0–5.0)
Eosinophils Absolute: 0.2 10*3/uL (ref 0.0–0.7)
HCT: 36.9 % (ref 36.0–46.0)
HEMOGLOBIN: 11.9 g/dL — AB (ref 12.0–15.0)
LYMPHS ABS: 2 10*3/uL (ref 0.7–4.0)
Lymphocytes Relative: 37.3 % (ref 12.0–46.0)
MCHC: 32.4 g/dL (ref 30.0–36.0)
MCV: 76.8 fl — ABNORMAL LOW (ref 78.0–100.0)
MONOS PCT: 7.4 % (ref 3.0–12.0)
Monocytes Absolute: 0.4 10*3/uL (ref 0.1–1.0)
NEUTROS PCT: 51.2 % (ref 43.0–77.0)
Neutro Abs: 2.8 10*3/uL (ref 1.4–7.7)
Platelets: 248 10*3/uL (ref 150.0–400.0)
RBC: 4.8 Mil/uL (ref 3.87–5.11)
RDW: 15.1 % (ref 11.5–15.5)
WBC: 5.5 10*3/uL (ref 4.0–10.5)

## 2017-12-26 LAB — TSH: TSH: 1.98 u[IU]/mL (ref 0.35–4.50)

## 2017-12-26 MED ORDER — ESOMEPRAZOLE MAGNESIUM 40 MG PO CPDR: 40.0000 mg | DELAYED_RELEASE_CAPSULE | Freq: Every day | ORAL | 3 refills | Status: DC

## 2017-12-26 MED ORDER — GABAPENTIN 100 MG PO CAPS: ORAL_CAPSULE | ORAL | 1 refills | Status: DC

## 2017-12-26 MED ORDER — OLMESARTAN MEDOXOMIL-HCTZ 20-12.5 MG PO TABS: 1.0000 | ORAL_TABLET | Freq: Every day | ORAL | 3 refills | Status: DC

## 2017-12-26 MED ORDER — ESCITALOPRAM OXALATE 20 MG PO TABS: 20.0000 mg | ORAL_TABLET | Freq: Every day | ORAL | 3 refills | Status: DC

## 2017-12-26 NOTE — Progress Notes (Signed)
   Subjective:    Patient ID: Pamela Daugherty, female    DOB: 02/08/1951, 67 y.o.   MRN: [id number]  HPI Here to follow up. She feels well in general. She asks about increasing the dose of Gabapentin at bedtime to help with feet burning. Her BP is stable. Her moods are stable. Her IBS is well controlled.    Review of Systems  Constitutional: Negative.   HENT: Negative.   Eyes: Negative.   Respiratory: Negative.   Cardiovascular: Negative.   Gastrointestinal: Negative.   Genitourinary: Negative for decreased urine volume, difficulty urinating, dyspareunia, dysuria, enuresis, flank pain, frequency, hematuria, pelvic pain and urgency.  Musculoskeletal: Negative.   Skin: Negative.   Neurological: Negative.   Psychiatric/Behavioral: Negative.        Objective:   Physical Exam  Constitutional: She is oriented to person, place, and time. She appears well-developed and well-nourished. No distress.  HENT:  Head: Normocephalic and atraumatic.  Right Ear: External ear normal.  Left Ear: External ear normal.  Nose: Nose normal.  Mouth/Throat: Oropharynx is clear and moist. No oropharyngeal exudate.  Eyes: Conjunctivae and EOM are normal. Pupils are equal, round, and reactive to light. No scleral icterus.  Neck: Normal range of motion. Neck supple. No JVD present. No thyromegaly present.  Cardiovascular: Normal rate, regular rhythm, normal heart sounds and intact distal pulses. Exam reveals no gallop and no friction rub.  No murmur heard. Pulmonary/Chest: Effort normal and breath sounds normal. No respiratory distress. She has no wheezes. She has no rales. She exhibits no tenderness.  Abdominal: Soft. Bowel sounds are normal. She exhibits no distension and no mass. There is no tenderness. There is no rebound and no guarding.  Musculoskeletal: Normal range of motion. She exhibits no edema or tenderness.  Lymphadenopathy:    She has no cervical adenopathy.  Neurological: She is alert and  oriented to person, place, and time. She has normal reflexes. No cranial nerve deficit. She exhibits normal muscle tone. Coordination normal.  Skin: Skin is warm and dry. No rash noted. No erythema.  Psychiatric: She has a normal mood and affect. Her behavior is normal. Judgment and thought content normal.          Assessment & Plan:  Her HTN and IBS are stable. Her depression and anxiety are stable. We will increase the bedtime dos eof Gabapentin to 200 mg. Get fasting labs.  Alysia Penna, MD

## 2017-12-27 ENCOUNTER — Encounter: Payer: Self-pay | Admitting: Family Medicine

## 2018-04-06 ENCOUNTER — Encounter: Payer: Self-pay | Admitting: Family Medicine

## 2018-04-06 ENCOUNTER — Telehealth: Payer: Self-pay

## 2018-04-06 ENCOUNTER — Ambulatory Visit (INDEPENDENT_AMBULATORY_CARE_PROVIDER_SITE_OTHER): Payer: Medicare Other | Admitting: Family Medicine

## 2018-04-06 VITALS — BP 120/82 | HR 66 | Temp 98.1°F | Wt 185.0 lb

## 2018-04-06 DIAGNOSIS — L409 Psoriasis, unspecified: Secondary | ICD-10-CM

## 2018-04-06 DIAGNOSIS — L232 Allergic contact dermatitis due to cosmetics: Secondary | ICD-10-CM

## 2018-04-06 MED ORDER — CLOBETASOL PROPIONATE EMULSION 0.05 % EX FOAM: 1.0000 "application " | Freq: Two times a day (BID) | CUTANEOUS | 3 refills | Status: DC

## 2018-04-06 MED ORDER — METHYLPREDNISOLONE ACETATE 40 MG/ML IJ SUSP
40.0000 mg | Freq: Once | INTRAMUSCULAR | Status: AC
  Administered 2018-04-06: 40 mg via INTRAMUSCULAR

## 2018-04-06 MED ORDER — CLOBETASOL PROPIONATE EMULSION 0.05 % EX FOAM: 1.0000 "application " | Freq: Two times a day (BID) | CUTANEOUS | 5 refills | Status: DC

## 2018-04-06 MED ORDER — METHYLPREDNISOLONE ACETATE 80 MG/ML IJ SUSP
80.0000 mg | Freq: Once | INTRAMUSCULAR | Status: AC
  Administered 2018-04-06: 80 mg via INTRAMUSCULAR

## 2018-04-06 NOTE — Progress Notes (Signed)
   Subjective:    Patient ID: Pamela Daugherty, female    DOB: [birth year], 67 y.o.   MRN: [id number]  HPI Here for one week of itching and burning on the scalp. She has had a flaky scalp for years. Also she has had 3 days of redness, itching and burning of the skin on the neck, upper back, and the tops of both shoulders. For the past month she has been using a new shampoo suggested by her hair stylist that is supposed to promote hair growth. No recent sun exposures.    Review of Systems  Constitutional: Negative.   Respiratory: Negative.   Cardiovascular: Negative.   Skin: Positive for rash.       Objective:   Physical Exam  Constitutional: She appears well-developed and well-nourished. No distress.  Cardiovascular: Normal rate, regular rhythm, normal heart sounds and intact distal pulses.  Pulmonary/Chest: Effort normal and breath sounds normal.  Skin:  The entire scalp is red and covered with heavy flaking of the skin. There is also macular erythema on the back of the neck, on the tops of the shoulders, and on the upper back without scaling.           Assessment & Plan:  She has long term psoriasis of the scalp, and she will use Clobetasol foam bid for a week or two to get this under control. She also has a dermatitis rash on the neck and back which is likely a reaction from the new shampoo she has been using. I advised her to stop using the shampoo and to go back to her usual hypoallergenic OTC shampoo. She is given a steroid shot today to ease the itching and she can take Zyrtec bid as needed.  Alysia Penna, MD

## 2018-04-06 NOTE — Telephone Encounter (Signed)
Copied from [place] [id number]. Topic: Inquiry >> Apr 06, 2018  2:32 PM Pricilla Handler wrote: Reason for CRM: Patient saw Dr. Sarajane Jews earlier today. Dr. Sarajane Jews prescribed patient the medication Clobetasol Propionate Emulsion 0.05 % topical foam. Patient states that the medication was too expensive at her local pharmacy. Patient wants Dr. Sarajane Jews to send this medication to her mail order pharmacy [place], [place] [phone] (Phone)  [phone] (Fax).         Thank You!!!

## 2018-04-06 NOTE — Addendum Note (Signed)
Addended by: Gwenyth Ober R on: 04/06/2018 01:55 PM   Modules accepted: Orders

## 2018-04-06 NOTE — Telephone Encounter (Signed)
I sent this to her mail away pharmacy

## 2018-04-06 NOTE — Telephone Encounter (Signed)
Noted  

## 2018-04-07 ENCOUNTER — Encounter: Payer: Self-pay | Admitting: Family Medicine

## 2018-04-10 ENCOUNTER — Encounter: Payer: Self-pay | Admitting: Family Medicine

## 2018-05-07 ENCOUNTER — Encounter: Payer: Self-pay | Admitting: Family Medicine

## 2018-05-07 ENCOUNTER — Other Ambulatory Visit: Payer: Self-pay

## 2018-05-07 MED ORDER — HYDROCHLOROTHIAZIDE 12.5 MG PO CAPS: 12.5000 mg | ORAL_CAPSULE | Freq: Every day | ORAL | 3 refills | Status: DC

## 2018-05-07 MED ORDER — OLMESARTAN MEDOXOMIL 20 MG PO TABS: 20.0000 mg | ORAL_TABLET | Freq: Every day | ORAL | 3 refills | Status: DC

## 2018-09-03 DIAGNOSIS — Z1231 Encounter for screening mammogram for malignant neoplasm of breast: Secondary | ICD-10-CM | POA: Diagnosis not present

## 2018-09-04 ENCOUNTER — Ambulatory Visit: Payer: Self-pay

## 2018-09-04 ENCOUNTER — Telehealth: Payer: Self-pay | Admitting: Family Medicine

## 2018-09-04 ENCOUNTER — Ambulatory Visit (INDEPENDENT_AMBULATORY_CARE_PROVIDER_SITE_OTHER): Payer: Medicare Other | Admitting: Family Medicine

## 2018-09-04 ENCOUNTER — Ambulatory Visit: Payer: Medicare Other

## 2018-09-04 ENCOUNTER — Encounter: Payer: Self-pay | Admitting: Family Medicine

## 2018-09-04 VITALS — BP 110/72 | HR 82 | Temp 97.9°F | Wt 192.5 lb

## 2018-09-04 DIAGNOSIS — N390 Urinary tract infection, site not specified: Secondary | ICD-10-CM | POA: Diagnosis not present

## 2018-09-04 MED ORDER — CIPROFLOXACIN HCL 500 MG PO TABS: 500.0000 mg | ORAL_TABLET | Freq: Two times a day (BID) | ORAL | 0 refills | Status: DC

## 2018-09-04 NOTE — Progress Notes (Signed)
   Subjective:    Patient ID: [name], female    DOB: 1951-07-29, 67 y.o.   MRN: [id number]  HPI Here for 2 days of urinary urgency and lower abdominal cramping. No blood in the urine. No fever or nausea. Drinking water and cranberry juice.    Review of Systems  Constitutional: Negative.   Respiratory: Negative.   Cardiovascular: Negative.   Gastrointestinal: Positive for abdominal pain. Negative for abdominal distention, anal bleeding, blood in stool, constipation, diarrhea, nausea, rectal pain and vomiting.  Genitourinary: Positive for dysuria, frequency and urgency. Negative for hematuria.       Objective:   Physical Exam  Constitutional: She appears well-developed and well-nourished.  Cardiovascular: Normal rate, regular rhythm, normal heart sounds and intact distal pulses.  Pulmonary/Chest: Effort normal and breath sounds normal.  Abdominal: Soft. Bowel sounds are normal. She exhibits no distension and no mass. There is no tenderness. There is no rebound and no guarding.          Assessment & Plan:  UTI, treat with Cipro. Culture the sample.  Alysia Penna, MD

## 2018-09-04 NOTE — Telephone Encounter (Unsigned)
Copied from [place] [id number]. Topic: Quick Communication - Rx Refill/Question >> Sep 04, 2018  7:53 AM Scherrie Gerlach wrote: Medication: nitrofurantoin, macrocrystal-monohydrate, (MACROBID) 100 MG capsule  Pt wants to know if dr will refill this med for her.  Pt states she gets a lot of UTI. Pt is having chills/ lower back pain, cannot urinate and nauseous.. [id number] [address], [place] [phone] (Phone) [phone] (Fax)  Pt wants to know if she should get her flu shot today at 1 pm since she has this issue.

## 2018-09-04 NOTE — Telephone Encounter (Signed)
Pt. States yesterday started having pain when voiding, not emptying bladder, chills, nausea, abdominal pain. Appointment made for today with her provider. Reason for Disposition . Urinating more frequently than usual (i.e., frequency)  Answer Assessment - Initial Assessment Questions 1. SYMPTOM: "What's the main symptom you're concerned about?" (e.g., frequency, incontinence)     Pain, burning, not emptying, abdominal pain 2. ONSET: "When did the  Pain  start?"     Yesterday 3. PAIN: "Is there any pain?" If so, ask: "How bad is it?" (Scale: 1-10; mild, moderate, severe)     10 4. CAUSE: "What do you think is causing the symptoms?"     UTI 5. OTHER SYMPTOMS: "Do you have any other symptoms?" (e.g., fever, flank pain, blood in urine, pain with urination)     Pain, chills 6. PREGNANCY: "Is there any chance you are pregnant?" "When was your last menstrual period?"     No  Protocols used: URINARY [facility]

## 2018-09-05 LAB — URINE CULTURE
MICRO NUMBER:: 91361417
SPECIMEN QUALITY:: ADEQUATE

## 2018-09-11 ENCOUNTER — Encounter: Payer: Self-pay | Admitting: *Deleted

## 2018-09-27 ENCOUNTER — Ambulatory Visit: Payer: Medicare Other

## 2018-11-06 ENCOUNTER — Ambulatory Visit (INDEPENDENT_AMBULATORY_CARE_PROVIDER_SITE_OTHER): Payer: Medicare Other

## 2018-11-06 DIAGNOSIS — Z23 Encounter for immunization: Secondary | ICD-10-CM

## 2018-12-24 ENCOUNTER — Ambulatory Visit (INDEPENDENT_AMBULATORY_CARE_PROVIDER_SITE_OTHER): Payer: Medicare Other | Admitting: Family Medicine

## 2018-12-24 ENCOUNTER — Encounter: Payer: Self-pay | Admitting: Family Medicine

## 2018-12-24 VITALS — BP 126/78 | HR 104 | Temp 98.7°F | Ht 67.0 in | Wt 184.1 lb

## 2018-12-24 DIAGNOSIS — J209 Acute bronchitis, unspecified: Secondary | ICD-10-CM | POA: Diagnosis not present

## 2018-12-24 MED ORDER — HYDROCODONE-HOMATROPINE 5-1.5 MG/5ML PO SYRP: 5.0000 mL | ORAL_SOLUTION | ORAL | 0 refills | Status: DC | PRN

## 2018-12-24 MED ORDER — METHYLPREDNISOLONE ACETATE 80 MG/ML IJ SUSP
120.0000 mg | Freq: Once | INTRAMUSCULAR | Status: AC
  Administered 2018-12-24: 120 mg via INTRAMUSCULAR

## 2018-12-24 MED ORDER — AZITHROMYCIN 250 MG PO TABS: ORAL_TABLET | ORAL | 0 refills | Status: DC

## 2018-12-24 NOTE — Addendum Note (Signed)
Addended by: Elie Confer on: 12/24/2018 10:33 AM   Modules accepted: Orders

## 2018-12-24 NOTE — Progress Notes (Signed)
   Subjective:    Patient ID: Pamela Daugherty, female    DOB: [birth year], 69 y.o.   MRN: [id number]  HPI Here for 9 days of stuffy head, ST, chest tightness, wheezing, and coughing up green sputum. Using Mucinex. No fever.    Review of Systems  Constitutional: Negative.   HENT: Positive for congestion, postnasal drip and sore throat. Negative for sinus pressure and sinus pain.   Eyes: Negative.   Respiratory: Positive for cough, chest tightness and wheezing. Negative for shortness of breath.   Cardiovascular: Negative for chest pain.       Objective:   Physical Exam Constitutional:      Appearance: She is ill-appearing.  HENT:     Right Ear: Tympanic membrane and ear canal normal.     Left Ear: Tympanic membrane and ear canal normal.     Nose: Nose normal.     Mouth/Throat:     Pharynx: Oropharynx is clear.  Eyes:     Conjunctiva/sclera: Conjunctivae normal.  Pulmonary:     Effort: Pulmonary effort is normal. No respiratory distress.     Breath sounds: Wheezing and rhonchi present. No rales.  Lymphadenopathy:     Cervical: No cervical adenopathy.  Neurological:     Mental Status: She is alert.           Assessment & Plan:  Bronchitis, treat with a Zpack and a steroid shot.  Alysia Penna, MD

## 2019-01-06 ENCOUNTER — Encounter: Payer: Self-pay | Admitting: Family Medicine

## 2019-01-06 DIAGNOSIS — K219 Gastro-esophageal reflux disease without esophagitis: Secondary | ICD-10-CM

## 2019-01-07 MED ORDER — ESOMEPRAZOLE MAGNESIUM 40 MG PO CPDR: 40.0000 mg | DELAYED_RELEASE_CAPSULE | Freq: Every day | ORAL | 0 refills | Status: DC

## 2019-01-14 ENCOUNTER — Encounter: Payer: Self-pay | Admitting: Family Medicine

## 2019-01-15 MED ORDER — GABAPENTIN 100 MG PO CAPS: ORAL_CAPSULE | ORAL | 1 refills | Status: DC

## 2019-01-15 MED ORDER — OLMESARTAN MEDOXOMIL 20 MG PO TABS: 20.0000 mg | ORAL_TABLET | Freq: Every day | ORAL | 3 refills | Status: DC

## 2019-01-15 MED ORDER — ESCITALOPRAM OXALATE 20 MG PO TABS: 20.0000 mg | ORAL_TABLET | Freq: Every day | ORAL | 3 refills | Status: DC

## 2019-01-15 MED ORDER — HYDROCHLOROTHIAZIDE 12.5 MG PO CAPS: 12.5000 mg | ORAL_CAPSULE | Freq: Every day | ORAL | 3 refills | Status: DC

## 2019-01-23 ENCOUNTER — Encounter: Payer: Medicare Other | Admitting: Family Medicine

## 2019-04-05 ENCOUNTER — Encounter: Payer: Self-pay | Admitting: Family Medicine

## 2019-04-05 NOTE — Telephone Encounter (Signed)
Dr. Sarajane Jews please advise.  I offered virtual visit but the pt is requesting that something be called in as she is working.  Thanks

## 2019-04-05 NOTE — Telephone Encounter (Signed)
She needs to be seen for this, Doxy would be okay

## 2019-04-08 ENCOUNTER — Ambulatory Visit: Payer: Medicare Other | Admitting: Family Medicine

## 2019-04-19 ENCOUNTER — Other Ambulatory Visit: Payer: Self-pay | Admitting: Family Medicine

## 2019-04-19 DIAGNOSIS — K219 Gastro-esophageal reflux disease without esophagitis: Secondary | ICD-10-CM

## 2019-04-19 MED ORDER — ESOMEPRAZOLE MAGNESIUM 40 MG PO CPDR: 40.0000 mg | DELAYED_RELEASE_CAPSULE | Freq: Every day | ORAL | 0 refills | Status: DC

## 2019-04-24 ENCOUNTER — Encounter: Payer: Medicare Other | Admitting: Family Medicine

## 2019-05-28 ENCOUNTER — Ambulatory Visit (INDEPENDENT_AMBULATORY_CARE_PROVIDER_SITE_OTHER): Payer: Medicare Other | Admitting: Family Medicine

## 2019-05-28 ENCOUNTER — Encounter: Payer: Self-pay | Admitting: Family Medicine

## 2019-05-28 ENCOUNTER — Other Ambulatory Visit: Payer: Self-pay

## 2019-05-28 VITALS — BP 150/60 | HR 73 | Temp 98.1°F | Wt 190.0 lb

## 2019-05-28 DIAGNOSIS — F418 Other specified anxiety disorders: Secondary | ICD-10-CM

## 2019-05-28 DIAGNOSIS — R300 Dysuria: Secondary | ICD-10-CM | POA: Diagnosis not present

## 2019-05-28 DIAGNOSIS — I10 Essential (primary) hypertension: Secondary | ICD-10-CM | POA: Diagnosis not present

## 2019-05-28 DIAGNOSIS — D509 Iron deficiency anemia, unspecified: Secondary | ICD-10-CM

## 2019-05-28 DIAGNOSIS — Z0000 Encounter for general adult medical examination without abnormal findings: Secondary | ICD-10-CM

## 2019-05-28 DIAGNOSIS — K581 Irritable bowel syndrome with constipation: Secondary | ICD-10-CM

## 2019-05-28 LAB — HEPATIC FUNCTION PANEL
ALT: 10 U/L (ref 0–35)
AST: 26 U/L (ref 0–37)
Albumin: 4.1 g/dL (ref 3.5–5.2)
Alkaline Phosphatase: 76 U/L (ref 39–117)
Bilirubin, Direct: 0.1 mg/dL (ref 0.0–0.3)
Total Bilirubin: 0.4 mg/dL (ref 0.2–1.2)
Total Protein: 6.5 g/dL (ref 6.0–8.3)

## 2019-05-28 LAB — POC URINALSYSI DIPSTICK (AUTOMATED)
Bilirubin, UA: NEGATIVE
Blood, UA: NEGATIVE
Glucose, UA: NEGATIVE
Ketones, UA: NEGATIVE
Nitrite, UA: NEGATIVE
Protein, UA: POSITIVE — AB
Spec Grav, UA: >=1.03 — AB (ref 1.010–1.025)
Urobilinogen, UA: 0.2 E.U./dL
pH, UA: 5.5 (ref 5.0–8.0)

## 2019-05-28 LAB — BASIC METABOLIC PANEL
BUN: 12 mg/dL (ref 6–23)
CO2: 27 mEq/L (ref 19–32)
Calcium: 9.4 mg/dL (ref 8.4–10.5)
Chloride: 105 mEq/L (ref 96–112)
Creatinine, Ser: 0.71 mg/dL (ref 0.40–1.20)
GFR: 81.92 mL/min (ref >60.00)
Glucose, Bld: 86 mg/dL (ref 70–99)
Potassium: 4.2 mEq/L (ref 3.5–5.1)
Sodium: 141 mEq/L (ref 135–145)

## 2019-05-28 LAB — CBC WITH DIFFERENTIAL/PLATELET
Basophils Absolute: 0 10*3/uL (ref 0.0–0.1)
Basophils Relative: 0.7 % (ref 0.0–3.0)
Eosinophils Absolute: 0.4 10*3/uL (ref 0.0–0.7)
Eosinophils Relative: 6.4 % — ABNORMAL HIGH (ref 0.0–5.0)
HCT: 35.4 % — ABNORMAL LOW (ref 36.0–46.0)
Hemoglobin: 11.7 g/dL — ABNORMAL LOW (ref 12.0–15.0)
Lymphocytes Relative: 35.5 % (ref 12.0–46.0)
Lymphs Abs: 2.2 10*3/uL (ref 0.7–4.0)
MCHC: 33.1 g/dL (ref 30.0–36.0)
MCV: 78.1 fl (ref 78.0–100.0)
Monocytes Absolute: 0.4 10*3/uL (ref 0.1–1.0)
Monocytes Relative: 6.5 % (ref 3.0–12.0)
Neutro Abs: 3.1 10*3/uL (ref 1.4–7.7)
Neutrophils Relative %: 50.9 % (ref 43.0–77.0)
Platelets: 245 10*3/uL (ref 150.0–400.0)
RBC: 4.53 Mil/uL (ref 3.87–5.11)
RDW: 14.9 % (ref 11.5–15.5)
WBC: 6.1 10*3/uL (ref 4.0–10.5)

## 2019-05-28 LAB — LIPID PANEL
Cholesterol: 179 mg/dL (ref 0–200)
HDL: 47 mg/dL (ref >39.00)
LDL Cholesterol: 105 mg/dL — ABNORMAL HIGH (ref 0–99)
NonHDL: 131.94
Total CHOL/HDL Ratio: 4
Triglycerides: 136 mg/dL (ref 0.0–149.0)
VLDL: 27.2 mg/dL (ref 0.0–40.0)

## 2019-05-28 LAB — TSH: TSH: 2.61 u[IU]/mL (ref 0.35–4.50)

## 2019-05-28 NOTE — Progress Notes (Signed)
Subjective:    Patient ID: Pamela Daugherty, female    DOB: Nov 08, 1950, 68 y.o.   MRN: [id number]  HPI Here to follow up on issues. Her BP has been well controlled, but she has not taken any medication yet this morning. Her depression and anxiety are stable. Her bowels are moving quite well. She does mention 6 weeks of occasional urge to urinate and she gets a mild dull pain inside her body towards the end of urinations sometimes. This is not a burning like when she has a UTI. Her urine has no change in appearance or smell. Also 5 days ago she noticed a dark spot on her right forearm. There are no symptoms and she denies any recent trauma.    Review of Systems  Constitutional: Negative.   HENT: Negative.   Eyes: Negative.   Respiratory: Negative.   Cardiovascular: Negative.   Gastrointestinal: Negative.   Genitourinary: Positive for pelvic pain and urgency. Negative for decreased urine volume, difficulty urinating, dyspareunia, dysuria, enuresis, flank pain, frequency and hematuria.  Musculoskeletal: Negative.   Skin: Negative.   Neurological: Negative.   Psychiatric/Behavioral: Negative.        Objective:   Physical Exam Constitutional:      General: She is not in acute distress.    Appearance: She is well-developed.  HENT:     Head: Normocephalic and atraumatic.     Right Ear: External ear normal.     Left Ear: External ear normal.     Nose: Nose normal.     Mouth/Throat:     Pharynx: No oropharyngeal exudate.  Eyes:     General: No scleral icterus.    Conjunctiva/sclera: Conjunctivae normal.     Pupils: Pupils are equal, round, and reactive to light.  Neck:     Musculoskeletal: Normal range of motion and neck supple.     Thyroid: No thyromegaly.     Vascular: No JVD.  Cardiovascular:     Rate and Rhythm: Normal rate and regular rhythm.     Heart sounds: Normal heart sounds. No murmur. No friction rub. No gallop.   Pulmonary:     Effort: Pulmonary effort is normal. No  respiratory distress.     Breath sounds: Normal breath sounds. No wheezing or rales.  Chest:     Chest wall: No tenderness.  Abdominal:     General: Bowel sounds are normal. There is no distension.     Palpations: Abdomen is soft. There is no mass.     Tenderness: There is no abdominal tenderness. There is no guarding or rebound.  Musculoskeletal: Normal range of motion.        General: No tenderness.  Lymphadenopathy:     Cervical: No cervical adenopathy.  Skin:    General: Skin is warm and dry.     Findings: No erythema or rash.     Comments: The right forearm has a round well defined 1 cm macular area of deep purple color. This does not appear to be petechial.   Neurological:     Mental Status: She is alert and oriented to person, place, and time.     Cranial Nerves: No cranial nerve deficit.     Motor: No abnormal muscle tone.     Coordination: Coordination normal.     Deep Tendon Reflexes: Reflexes are normal and symmetric. Reflexes normal.  Psychiatric:        Behavior: Behavior normal.        Thought Content: Thought content  normal.        Judgment: Judgment normal.           Assessment & Plan:  Her HTN and depression with anxiety are stable. Her constipation is well controlled. Her urinary symptoms are consistent with interstitial cystitis, but we will culture a sample today to rule out infection. I advised her to avoid drinking tea or sodas and to drink lots of water. The spot on her forearm looks like a resolving insect bite but I asked her to watch this closely. If it has not faded away at all in the next week, we may need to check this again. Get fasting labs to check lipids, anemia, etc. We will set her up for another colonoscopy.  Alysia Penna, MD

## 2019-05-29 ENCOUNTER — Encounter: Payer: Self-pay | Admitting: Gastroenterology

## 2019-05-30 ENCOUNTER — Other Ambulatory Visit: Payer: Self-pay | Admitting: Family Medicine

## 2019-05-30 LAB — URINE CULTURE
MICRO NUMBER:: 735200
SPECIMEN QUALITY:: ADEQUATE

## 2019-05-30 MED ORDER — CIPROFLOXACIN HCL 500 MG PO TABS: 500.0000 mg | ORAL_TABLET | Freq: Two times a day (BID) | ORAL | 0 refills | Status: DC

## 2019-06-14 ENCOUNTER — Other Ambulatory Visit: Payer: Self-pay

## 2019-06-14 ENCOUNTER — Ambulatory Visit (AMBULATORY_SURGERY_CENTER): Payer: Self-pay

## 2019-06-14 VITALS — Ht 67.0 in | Wt 188.8 lb

## 2019-06-14 DIAGNOSIS — D509 Iron deficiency anemia, unspecified: Secondary | ICD-10-CM

## 2019-06-14 MED ORDER — NA SULFATE-K SULFATE-MG SULF 17.5-3.13-1.6 GM/177ML PO SOLN: 1.0000 | Freq: Once | ORAL | 0 refills | Status: AC

## 2019-06-14 NOTE — Progress Notes (Signed)
Denies allergies to eggs or soy products. Denies complication of anesthesia or sedation. Denies use of weight loss medication. Denies use of O2.   Emmi instructions given for colonoscopy.  

## 2019-06-27 ENCOUNTER — Telehealth: Payer: Self-pay

## 2019-06-27 NOTE — Telephone Encounter (Signed)
Covid-19 screening questions   Do you now or have you had a fever in the last 14 days? NO   Do you have any respiratory symptoms of shortness of breath or cough now or in the last 14 days? NO  Do you have any family members or close contacts with diagnosed or suspected Covid-19 in the past 14 days? NO  Have you been tested for Covid-19 and found to be positive? NO        

## 2019-06-28 ENCOUNTER — Ambulatory Visit (AMBULATORY_SURGERY_CENTER): Payer: Medicare Other | Admitting: Gastroenterology

## 2019-06-28 ENCOUNTER — Encounter: Payer: Self-pay | Admitting: Gastroenterology

## 2019-06-28 ENCOUNTER — Other Ambulatory Visit: Payer: Self-pay

## 2019-06-28 VITALS — BP 141/115 | HR 60 | Temp 97.6°F | Resp 16 | Ht 67.0 in | Wt 184.0 lb

## 2019-06-28 DIAGNOSIS — D649 Anemia, unspecified: Secondary | ICD-10-CM | POA: Diagnosis not present

## 2019-06-28 DIAGNOSIS — K3189 Other diseases of stomach and duodenum: Secondary | ICD-10-CM | POA: Diagnosis not present

## 2019-06-28 DIAGNOSIS — K635 Polyp of colon: Secondary | ICD-10-CM

## 2019-06-28 DIAGNOSIS — D123 Benign neoplasm of transverse colon: Secondary | ICD-10-CM | POA: Diagnosis not present

## 2019-06-28 DIAGNOSIS — K449 Diaphragmatic hernia without obstruction or gangrene: Secondary | ICD-10-CM | POA: Diagnosis not present

## 2019-06-28 DIAGNOSIS — D509 Iron deficiency anemia, unspecified: Secondary | ICD-10-CM

## 2019-06-28 DIAGNOSIS — K2100 Gastro-esophageal reflux disease with esophagitis, without bleeding: Secondary | ICD-10-CM

## 2019-06-28 DIAGNOSIS — R197 Diarrhea, unspecified: Secondary | ICD-10-CM | POA: Diagnosis not present

## 2019-06-28 DIAGNOSIS — K2970 Gastritis, unspecified, without bleeding: Secondary | ICD-10-CM

## 2019-06-28 DIAGNOSIS — K317 Polyp of stomach and duodenum: Secondary | ICD-10-CM | POA: Diagnosis not present

## 2019-06-28 HISTORY — PX: COLONOSCOPY: SHX174

## 2019-06-28 HISTORY — PX: ESOPHAGOGASTRODUODENOSCOPY: SHX1529

## 2019-06-28 MED ORDER — SODIUM CHLORIDE 0.9 % IV SOLN: 500.0000 mL | Freq: Once | INTRAVENOUS | Status: DC

## 2019-06-28 NOTE — Progress Notes (Signed)
Called to room to assist during endoscopic procedure.  Patient ID and intended procedure confirmed with present staff. Received instructions for my participation in the procedure from the performing physician.  

## 2019-06-28 NOTE — Progress Notes (Signed)
Pt's states no medical or surgical changes since previsit or office visit. 

## 2019-06-28 NOTE — Progress Notes (Signed)
PATIENT OOB TO RESTROOM CHANGING IN RESTROOM VOIDED LARGE AMOUNT BACK TO BEDSIDE, RECHECKED VITAL SIGNS, 150/80. DISCHARGED

## 2019-06-28 NOTE — Patient Instructions (Signed)
Handouts given: Gastritis, Clip card, Polyps and Diverticulosis.  AVOID NSAIDS (MOTRIN,ADVIL,IBUPROFEN,ALEVE,NAPROSYN ETC) FOR 2 WEEKS, 07/12/2019.   YOU HAD AN ENDOSCOPIC PROCEDURE TODAY AT [facility]:   Refer to the procedure report that was given to you for any specific questions about what was found during the examination.  If the procedure report does not answer your questions, please call your gastroenterologist to clarify.  If you requested that your care partner not be given the details of your procedure findings, then the procedure report has been included in a sealed envelope for you to review at your convenience later.  YOU SHOULD EXPECT: Some feelings of bloating in the abdomen. Passage of more gas than usual.  Walking can help get rid of the air that was put into your GI tract during the procedure and reduce the bloating. If you had a lower endoscopy (such as a colonoscopy or flexible sigmoidoscopy) you may notice spotting of blood in your stool or on the toilet paper. If you underwent a bowel prep for your procedure, you may not have a normal bowel movement for a few days.  Please Note:  You might notice some irritation and congestion in your nose or some drainage.  This is from the oxygen used during your procedure.  There is no need for concern and it should clear up in a day or so.  SYMPTOMS TO REPORT IMMEDIATELY:   Following lower endoscopy (colonoscopy or flexible sigmoidoscopy):  Excessive amounts of blood in the stool  Significant tenderness or worsening of abdominal pains  Swelling of the abdomen that is new, acute  Fever of 100F or higher   Following upper endoscopy (EGD)  Vomiting of blood or coffee ground material  New chest pain or pain under the shoulder blades  Painful or persistently difficult swallowing  New shortness of breath  Fever of 100F or higher  Black, tarry-looking stools  For urgent or emergent issues, a gastroenterologist can be  reached at any hour by calling [phone].   DIET:  We do recommend a small meal at first, but then you may proceed to your regular diet.  Drink plenty of fluids but you should avoid alcoholic beverages for 24 hours.  ACTIVITY:  You should plan to take it easy for the rest of today and you should NOT DRIVE or use heavy machinery until tomorrow (because of the sedation medicines used during the test).    FOLLOW UP: Our staff will call the number listed on your records 48-72 hours following your procedure to check on you and address any questions or concerns that you may have regarding the information given to you following your procedure. If we do not reach you, we will leave a message.  We will attempt to reach you two times.  During this call, we will ask if you have developed any symptoms of COVID 19. If you develop any symptoms (ie: fever, flu-like symptoms, shortness of breath, cough etc.) before then, please call [phone].  If you test positive for Covid 19 in the 2 weeks post procedure, please call and report this information to [place].    If any biopsies were taken you will be contacted by phone or by letter within the next 1-3 weeks.  Please call us at [phone] if you have not heard about the biopsies in 3 weeks.    SIGNATURES/CONFIDENTIALITY: You and/or your care partner have signed paperwork which will be entered into your electronic medical record.  These signatures  attest to the fact that that the information above on your After Visit Summary has been reviewed and is understood.  Full responsibility of the confidentiality of this discharge information lies with you and/or your care-partner.

## 2019-06-28 NOTE — Progress Notes (Signed)
A/ox3, pleased with MAC, report to RN 

## 2019-06-28 NOTE — Op Note (Addendum)
[facility] Patient Name: Pamela Daugherty Procedure Date: 06/28/2019 8:37 AM MRN: [id number] Endoscopist: Remo Lipps P. Havery Moros , MD Age: 68 Referring MD:  Date of Birth: July 19, 1951 Gender: Female Account #: [id number] Procedure:                Colonoscopy Indications:              microcytic anemia, patient incidentally endorses                            chronic loose stools Medicines:                Monitored Anesthesia Care Procedure:                Pre-Anesthesia Assessment:                           - Prior to the procedure, a History and Physical                            was performed, and patient medications and                            allergies were reviewed. The patient's tolerance of                            previous anesthesia was also reviewed. The risks                            and benefits of the procedure and the sedation                            options and risks were discussed with the patient.                            All questions were answered, and informed consent                            was obtained. Prior Anticoagulants: The patient has                            taken no previous anticoagulant or antiplatelet                            agents. ASA Grade Assessment: II - A patient with                            mild systemic disease. After reviewing the risks                            and benefits, the patient was deemed in                            satisfactory condition to undergo the procedure.  After obtaining informed consent, the colonoscope                            was passed under direct vision. Throughout the                            procedure, the patient's blood pressure, pulse, and                            oxygen saturations were monitored continuously. The                            Colonoscope was introduced through the anus and                            advanced to the the terminal ileum,  with                            identification of the appendiceal orifice and IC                            valve. The colonoscopy was performed without                            difficulty. The patient tolerated the procedure                            well. The quality of the bowel preparation was                            good. The terminal ileum, ileocecal valve,                            appendiceal orifice, and rectum were photographed. Scope In: 9:06:27 AM Scope Out: 9:23:46 AM Scope Withdrawal Time: 0 hours 13 minutes 1 second  Total Procedure Duration: 0 hours 17 minutes 19 seconds  Findings:                 The perianal and digital rectal examinations were                            normal.                           The terminal ileum appeared normal.                           Multiple small-mouthed diverticula were found in                            the ascending colon and left colon.                           A 4 mm polyp was found in the transverse colon. The  polyp was flat. The polyp was removed with a cold                            snare. Resection and retrieval were complete.                           The exam was otherwise without abnormality.                           Biopsies for histology were taken with a cold                            forceps from the transverse colon, right transverse                            colon and left transverse colon for evaluation of                            microscopic colitis. Complications:            No immediate complications. Estimated blood loss:                            Minimal. Estimated Blood Loss:     Estimated blood loss was minimal. Impression:               No cause for anemia on colonoscopy. Suspect anemia                            could be related to gastric polyp which was removed.                           - The examined portion of the ileum was normal.                           -  Diverticulosis in the ascending colon and in the                            left colon.                           - One 4 mm polyp in the transverse colon, removed                            with a cold snare. Resected and retrieved.                           - The examination was otherwise normal.                           - Biopsies were taken with a cold forceps from the                            transverse colon, right transverse colon and left  transverse colon for evaluation of microscopic                            colitis. Recommendation:           - Patient has a contact number available for                            emergencies. The signs and symptoms of potential                            delayed complications were discussed with the                            patient. Return to normal activities tomorrow.                            Written discharge instructions were provided to the                            patient.                           - Resume previous diet.                           - Continue present medications.                           - Await pathology results. Remo Lipps P. Karin Griffith, MD 06/28/2019 9:28:14 AM This report has been signed electronically.

## 2019-06-28 NOTE — Op Note (Signed)
[place] Patient Name: Pamela Daugherty Procedure Date: 06/28/2019 8:37 AM MRN: [id number] Endoscopist: Remo Lipps P. Havery Moros , MD Age: 68 Referring MD:  Date of Birth: September 07, 1951 Gender: Female Account #: [id number] Procedure:                Upper GI endoscopy Indications:              Iron deficiency anemia, Follow-up of reflux                            esophagitis - severe esophagitis in 2014 noted on                            EGD, incidentally notes chronic loose stools, on                            nexium Medicines:                Monitored Anesthesia Care Procedure:                Pre-Anesthesia Assessment:                           - Prior to the procedure, a History and Physical                            was performed, and patient medications and                            allergies were reviewed. The patient's tolerance of                            previous anesthesia was also reviewed. The risks                            and benefits of the procedure and the sedation                            options and risks were discussed with the patient.                            All questions were answered, and informed consent                            was obtained. Prior Anticoagulants: The patient has                            taken no previous anticoagulant or antiplatelet                            agents. ASA Grade Assessment: II - A patient with                            mild systemic disease. After reviewing the risks  and benefits, the patient was deemed in                            satisfactory condition to undergo the procedure.                           After obtaining informed consent, the endoscope was                            passed under direct vision. Throughout the                            procedure, the patient's blood pressure, pulse, and                            oxygen saturations were monitored continuously. The                             Endoscope was introduced through the mouth, and                            advanced to the second part of duodenum. The upper                            GI endoscopy was accomplished without difficulty.                            The patient tolerated the procedure well. Scope In: Scope Out: Findings:                 Esophagogastric landmarks were identified: the                            Z-line was found at 36 cm, the gastroesophageal                            junction was found at 36 cm and the upper extent of                            the gastric folds was found at 38 cm from the                            incisors.                           A 2 cm hiatal hernia was present.                           The exam of the esophagus was otherwise normal.                           A single diminutive sessile polyp was found in the  gastric antrum. The polyp was removed with a cold                            biopsy forceps. Resection and retrieval were                            complete.                           A single 4 to 5 mm pedunculated and sessile                            inflamed polyp with stigmata of recent bleeding was                            found in the gastric antrum (adherent heme with                            slight oozing). The polyp was removed with a hot                            snare. Resection and retrieval were complete. There                            was persistent oozing noted following polypectomy.                            Multiple attempts with snare tip coagulation were                            made for hemostasis however there was persistent                            oozing at the left lateral margin, For hemostasis,                            two hemostatic clips were successfully placed and                            no bleeding was noted following this intervention.                            Inflammation characterized by erythema was found in                            the gastric antrum.                           The stomach appeared diffusely atrophic. The exam                            of the stomach was otherwise normal.  Biopsies were taken with a cold forceps in the                            gastric body, at the incisura and in the gastric                            antrum for Helicobacter pylori testing.                           The duodenal bulb and second portion of the                            duodenum were normal. Biopsies for histology were                            taken with a cold forceps for evaluation of celiac                            disease. Complications:            No immediate complications. Estimated blood loss:                            Minimal. Estimated Blood Loss:     Estimated blood loss was minimal. Impression:               - Esophagogastric landmarks identified.                           - 2 cm hiatal hernia.                           - Normal esophagus otherwise                           - A single gastric polyp. Resected and retrieved.                           - A single inflamed gastric polyp. Resected and                            retrieved. Clips were placed.                           - Gastritis.                           - Normal duodenal bulb and second portion of the                            duodenum. Biopsied.                           - Biopsies were taken with a cold forceps for  Helicobacter pylori testing.                           Suspect anemia could be due to gastric polyp based                            on appearance of it, removed. Recommendation:           - Patient has a contact number available for                            emergencies. The signs and symptoms of potential                            delayed complications were discussed with the                             patient. Return to normal activities tomorrow.                            Written discharge instructions were provided to the                            patient.                           - Resume previous diet.                           - Continue present medications.                           - Await pathology results.                           - No ibuprofen, naproxen, or other non-steroidal                            anti-inflammatory drugs for 2 weeks after polyp                            removal. Remo Lipps P. Laylia Mui, MD 06/28/2019 9:36:58 AM This report has been signed electronically.

## 2019-06-28 NOTE — Progress Notes (Signed)
Temperature taken by J.B., VS taken by C.W. 

## 2019-07-03 ENCOUNTER — Telehealth: Payer: Self-pay

## 2019-07-03 NOTE — Telephone Encounter (Signed)
  Follow up Call-  Call back number 06/28/2019  Post procedure Call Back phone  # [phone]  Permission to leave phone message Yes  Some recent data might be hidden     Patient questions:  Do you have a fever, pain , or abdominal swelling? No. Pain Score  0 *  Have you tolerated food without any problems? Yes.    Have you been able to return to your normal activities? Yes.    Do you have any questions about your discharge instructions: Diet   No. Medications  No. Follow up visit  No.  Do you have questions or concerns about your Care? No.  Actions: * If pain score is 4 or above: No action needed, pain <4.   Pt asked if her throat was stretched because she had a sore throat after the procedures.  I read the procedure report and explained her throat was not streched but there were biopsies taken.  Pt was told it takes 1-2 weeks to receive the pathology results.  Pt said her throat was much better now and no problems swallowing now either. Maw  1. Have you developed a fever since your procedure? no  2.   Have you had an respiratory symptoms (SOB or cough) since your procedure? no  3.   Have you tested positive for COVID 19 since your procedure no  4.   Have you had any family members/close contacts diagnosed with the COVID 19 since your procedure?  no   If yes to any of these questions please route to Joylene John, RN and Alphonsa Gin, Therapist, sports.

## 2019-07-03 NOTE — Telephone Encounter (Signed)
Called [phone] and left a messaged we tried to reach pt for a follow up call. maw

## 2019-07-08 ENCOUNTER — Other Ambulatory Visit: Payer: Self-pay

## 2019-07-08 DIAGNOSIS — D509 Iron deficiency anemia, unspecified: Secondary | ICD-10-CM

## 2019-08-06 ENCOUNTER — Other Ambulatory Visit: Payer: Self-pay | Admitting: Family Medicine

## 2019-08-06 DIAGNOSIS — K219 Gastro-esophageal reflux disease without esophagitis: Secondary | ICD-10-CM

## 2019-08-08 ENCOUNTER — Telehealth: Payer: Self-pay

## 2019-08-08 NOTE — Telephone Encounter (Signed)
Copied from [facility] [id number]. Topic: General - Call Back - No Documentation >> Aug 07, 2019  2:15 PM Selinda Flavin B, [place] wrote: Reason for CRM: Patient returning call to office. Unsure what call was regarding. Please advise. >> Aug 07, 2019  3:16 PM Cox, Melburn Hake, CMA wrote: I do not see any documentation of an outbound call to the pt.

## 2019-08-15 ENCOUNTER — Encounter: Payer: Self-pay | Admitting: Family Medicine

## 2019-08-15 DIAGNOSIS — K219 Gastro-esophageal reflux disease without esophagitis: Secondary | ICD-10-CM

## 2019-08-19 NOTE — Telephone Encounter (Signed)
I do not know. Please find out why this was denied

## 2019-08-20 MED ORDER — ESOMEPRAZOLE MAGNESIUM 40 MG PO CPDR: 40.0000 mg | DELAYED_RELEASE_CAPSULE | Freq: Every day | ORAL | 0 refills | Status: DC

## 2019-08-20 NOTE — Telephone Encounter (Signed)
Rx has been refilled.  

## 2019-08-21 ENCOUNTER — Encounter: Payer: Self-pay | Admitting: Family Medicine

## 2019-08-23 ENCOUNTER — Encounter: Payer: Self-pay | Admitting: Family Medicine

## 2019-08-23 ENCOUNTER — Other Ambulatory Visit: Payer: Self-pay | Admitting: Family Medicine

## 2019-08-23 DIAGNOSIS — K219 Gastro-esophageal reflux disease without esophagitis: Secondary | ICD-10-CM

## 2019-08-26 ENCOUNTER — Other Ambulatory Visit: Payer: Self-pay

## 2019-08-26 ENCOUNTER — Telehealth: Payer: Self-pay

## 2019-08-26 ENCOUNTER — Other Ambulatory Visit (INDEPENDENT_AMBULATORY_CARE_PROVIDER_SITE_OTHER): Payer: Medicare Other

## 2019-08-26 DIAGNOSIS — D509 Iron deficiency anemia, unspecified: Secondary | ICD-10-CM | POA: Diagnosis not present

## 2019-08-26 LAB — CBC WITH DIFFERENTIAL/PLATELET
Basophils Absolute: 0 10*3/uL (ref 0.0–0.1)
Basophils Relative: 0.5 % (ref 0.0–3.0)
Eosinophils Absolute: 0.2 10*3/uL (ref 0.0–0.7)
Eosinophils Relative: 2.8 % (ref 0.0–5.0)
HCT: 39.7 % (ref 36.0–46.0)
Hemoglobin: 13.1 g/dL (ref 12.0–15.0)
Lymphocytes Relative: 36.1 % (ref 12.0–46.0)
Lymphs Abs: 2.9 10*3/uL (ref 0.7–4.0)
MCHC: 33 g/dL (ref 30.0–36.0)
MCV: 81.4 fl (ref 78.0–100.0)
Monocytes Absolute: 0.6 10*3/uL (ref 0.1–1.0)
Monocytes Relative: 7.8 % (ref 3.0–12.0)
Neutro Abs: 4.2 10*3/uL (ref 1.4–7.7)
Neutrophils Relative %: 52.8 % (ref 43.0–77.0)
Platelets: 241 10*3/uL (ref 150.0–400.0)
RBC: 4.87 Mil/uL (ref 3.87–5.11)
RDW: 18.3 % — ABNORMAL HIGH (ref 11.5–15.5)
WBC: 8 10*3/uL (ref 4.0–10.5)

## 2019-08-26 MED ORDER — ESOMEPRAZOLE MAGNESIUM 40 MG PO CPDR: 40.0000 mg | DELAYED_RELEASE_CAPSULE | Freq: Every day | ORAL | 0 refills | Status: DC

## 2019-08-26 NOTE — Telephone Encounter (Signed)
Patient will come in today and have CBC drawn. Office visit scheduled with Dr. Havery Moros on 10/04/19. (first available)

## 2019-08-27 NOTE — Telephone Encounter (Signed)
Spoke to pt and she stated she is 100 % better. Pt stated she is taking AZO and cranberry juice.

## 2019-08-31 ENCOUNTER — Other Ambulatory Visit: Payer: Self-pay

## 2019-08-31 ENCOUNTER — Ambulatory Visit (INDEPENDENT_AMBULATORY_CARE_PROVIDER_SITE_OTHER): Payer: Medicare Other

## 2019-08-31 DIAGNOSIS — Z23 Encounter for immunization: Secondary | ICD-10-CM

## 2019-09-09 ENCOUNTER — Encounter: Payer: Self-pay | Admitting: Family Medicine

## 2019-09-09 DIAGNOSIS — Z1231 Encounter for screening mammogram for malignant neoplasm of breast: Secondary | ICD-10-CM | POA: Diagnosis not present

## 2019-09-27 ENCOUNTER — Encounter: Payer: Self-pay | Admitting: Family Medicine

## 2019-10-01 NOTE — Telephone Encounter (Signed)
Called pt again no answer

## 2019-10-02 DIAGNOSIS — D225 Melanocytic nevi of trunk: Secondary | ICD-10-CM | POA: Diagnosis not present

## 2019-10-02 DIAGNOSIS — L989 Disorder of the skin and subcutaneous tissue, unspecified: Secondary | ICD-10-CM | POA: Diagnosis not present

## 2019-10-02 DIAGNOSIS — L821 Other seborrheic keratosis: Secondary | ICD-10-CM | POA: Diagnosis not present

## 2019-10-02 DIAGNOSIS — Z872 Personal history of diseases of the skin and subcutaneous tissue: Secondary | ICD-10-CM | POA: Diagnosis not present

## 2019-10-02 DIAGNOSIS — B0089 Other herpesviral infection: Secondary | ICD-10-CM | POA: Diagnosis not present

## 2019-10-02 DIAGNOSIS — D485 Neoplasm of uncertain behavior of skin: Secondary | ICD-10-CM | POA: Diagnosis not present

## 2019-10-02 DIAGNOSIS — D1801 Hemangioma of skin and subcutaneous tissue: Secondary | ICD-10-CM | POA: Diagnosis not present

## 2019-10-02 DIAGNOSIS — L218 Other seborrheic dermatitis: Secondary | ICD-10-CM | POA: Diagnosis not present

## 2019-10-02 DIAGNOSIS — L570 Actinic keratosis: Secondary | ICD-10-CM | POA: Diagnosis not present

## 2019-10-02 DIAGNOSIS — L814 Other melanin hyperpigmentation: Secondary | ICD-10-CM | POA: Diagnosis not present

## 2019-10-02 NOTE — Telephone Encounter (Signed)
Noted  

## 2019-10-04 ENCOUNTER — Encounter: Payer: Self-pay | Admitting: Gastroenterology

## 2019-10-04 ENCOUNTER — Ambulatory Visit (INDEPENDENT_AMBULATORY_CARE_PROVIDER_SITE_OTHER): Payer: Medicare Other | Admitting: Gastroenterology

## 2019-10-04 VITALS — BP 110/70 | HR 84 | Temp 97.6°F | Ht 67.5 in | Wt 185.1 lb

## 2019-10-04 DIAGNOSIS — K3189 Other diseases of stomach and duodenum: Secondary | ICD-10-CM | POA: Diagnosis not present

## 2019-10-04 DIAGNOSIS — K31A0 Gastric intestinal metaplasia, unspecified: Secondary | ICD-10-CM

## 2019-10-04 DIAGNOSIS — K219 Gastro-esophageal reflux disease without esophagitis: Secondary | ICD-10-CM | POA: Diagnosis not present

## 2019-10-04 DIAGNOSIS — D509 Iron deficiency anemia, unspecified: Secondary | ICD-10-CM

## 2019-10-04 MED ORDER — ESOMEPRAZOLE MAGNESIUM 20 MG PO CPDR: 20.0000 mg | DELAYED_RELEASE_CAPSULE | Freq: Every day | ORAL | 3 refills | Status: DC

## 2019-10-04 NOTE — Progress Notes (Signed)
HPI :  68 year old female here for follow-up visit.  I know her for prior endoscopy and colonoscopy for suspected iron deficiency anemia.  In September we performed these exams, she had a small polyp in her colon which was benign, diverticulosis but no other cause for anemia.  Biopsies for microscopic colitis were negative. Her endoscopy was remarkable for small pedunculated inflamed polyp with oozing in the gastric antrum which was removed with hot snare.  The polyp was benign hyperplastic.  Biopsies of her stomach showed no evidence of H. pylori.  She did have evidence of focal gastric intestinal metaplasia. biopsy of the small bowel showed no evidence of celiac disease.  At the time of her exam she had a mildly microcytic anemia with a hemoglobin of 11.7.  She was placed on oral iron.  Her hemoglobin on November 2 had risen to 13.1.  She is generally feeling pretty well.  She stopped the iron about a week ago with the plan to observe her course following removal of the polyp and see if her hemoglobin is able to maintain.  She states her stools have become a little bit looser off the iron.  She has been on Nexium 40 mg once a day for a long time for her symptoms of reflux.  She questions how long she needs to be on this dose.  She has been on for quite a long time and it works well, she has no breakthrough reflux.  She denies any tobacco use  No gastric cancer in the family No colon cancer in the family  Colonoscopy 06/28/19 - The perianal and digital rectal examinations were normal. - The terminal ileum appeared normal. - Multiple small-mouthed diverticula were found in the ascending colon and left colon. - A 4 mm polyp was found in the transverse colon. The polyp was flat. The polyp was removed with a cold snare. Resection and retrieval were complete. - The exam was otherwise without abnormality. - Biopsies for histology were taken with a cold forceps from the transverse colon, right transverse  colon and left transverse colon for evaluation of microscopic colitis.  EGD 06/28/19 - Findings: - A 2 cm hiatal hernia was present. - The exam of the esophagus was otherwise normal. - A single diminutive sessile polyp was found in the gastric antrum. The polyp was removed with a cold biopsy forceps. Resection and retrieval were complete. - A single 4 to 5 mm pedunculated and sessile inflamed polyp with stigmata of recent bleeding was found in the gastric antrum (adherent heme with slight oozing). The polyp was removed with a hot snare. Resection and retrieval were complete. There was persistent oozing noted following polypectomy. Multiple attempts with snare tip coagulation were made for hemostasis however there was persistent oozing at the left lateral margin, For hemostasis, two hemostatic clips were successfully placed and no bleeding was noted following this intervention. - Inflammation characterized by erythema was found in the gastric antrum. - The stomach appeared diffusely atrophic. The exam of the stomach was otherwise normal. - Biopsies were taken with a cold forceps in the gastric body, at the incisura and in the gastric antrum for Helicobacter pylori testing. - The duodenal bulb and second portion of the duodenum were normal. Biopsies for histology were taken with a cold forceps for evaluation of celiac disease.   Past Medical History:  Diagnosis Date  . Anemia   . Anxiety   . B12 deficiency   . Depression   . Esophageal reflux   .  Esophageal stricture   . Fatty liver   . Hypertension   . IBS (irritable bowel syndrome)   . Insomnia   . Neck pain   . Routine gynecological examination    sees Dr. Alden Hipp      Past Surgical History:  Procedure Laterality Date  . APPENDECTOMY    . COLONOSCOPY  04-29-09   per Dr. Sharlett Iles, normal, repeat in 10 yrs   . ESOPHAGOGASTRODUODENOSCOPY  04-29-09   per Dr. Sharlett Iles, normal except stricture that was dilated   .  TONSILLECTOMY    . TUBAL LIGATION     Family History  Problem Relation Age of Onset  . Coronary artery disease Mother   . Diabetes Mother   . Irritable bowel syndrome Mother   . Colon cancer Neg Hx   . Esophageal cancer Neg Hx   . Rectal cancer Neg Hx   . Stomach cancer Neg Hx    Social History   Tobacco Use  . Smoking status: Never Smoker  . Smokeless tobacco: Never Used  Substance Use Topics  . Alcohol use: Yes    Alcohol/week: 2.0 standard drinks    Types: 2 Standard drinks or equivalent per week  . Drug use: No   Current Outpatient Medications  Medication Sig Dispense Refill  . ALPRAZolam (XANAX) 0.5 MG tablet Take 0.25 mg by mouth at bedtime as needed.    Marland Kitchen aspirin 81 MG EC tablet Take 81 mg by mouth daily.      . Clobetasol Propionate Emulsion 0.05 % topical foam Apply 1 application topically 2 (two) times daily. 100 g 3  . escitalopram (LEXAPRO) 20 MG tablet Take 1 tablet (20 mg total) by mouth daily. 90 tablet 3  . esomeprazole (NEXIUM) 40 MG capsule Take 1 capsule (40 mg total) by mouth daily before breakfast. 90 capsule 0  . fish oil-omega-3 fatty acids 1000 MG capsule Take 1 g by mouth daily.      Marland Kitchen gabapentin (NEURONTIN) 100 MG capsule One in the morning, one in the afternoon, and two at bedtime 360 capsule 1  . hydrochlorothiazide (MICROZIDE) 12.5 MG capsule Take 1 capsule (12.5 mg total) by mouth daily. 90 capsule 3  . Linaclotide (LINZESS) 145 MCG CAPS Take 145 mcg by mouth daily as needed.    . meclizine (ANTIVERT) 25 MG tablet Take 1 tablet (25 mg total) by mouth every 4 (four) hours as needed for dizziness. 90 tablet 3  . olmesartan-hydrochlorothiazide (BENICAR HCT) 20-12.5 MG tablet Take 1 tablet by mouth daily.    . valACYclovir (VALTREX) 500 MG tablet Take 1,000 mg by mouth 2 (two) times daily.    . vitamin B-12 (CYANOCOBALAMIN) 250 MCG tablet Take 250 mcg by mouth daily.    . vitamin E 600 UNIT capsule Take 600 Units by mouth daily.      Marland Kitchen OVER THE  COUNTER MEDICATION Hair, nail and skin vitamin, One tablet three times daily.     No current facility-administered medications for this visit.   Allergies  Allergen Reactions  . Penicillins     rash     Review of Systems: All systems reviewed and negative except where noted in HPI.   Lab Results  Component Value Date   WBC 8.0 08/26/2019   HGB 13.1 08/26/2019   HCT 39.7 08/26/2019   MCV 81.4 08/26/2019   PLT 241.0 08/26/2019    Lab Results  Component Value Date   CREATININE 0.71 05/28/2019   BUN 12 05/28/2019  NA 141 05/28/2019   K 4.2 05/28/2019   CL 105 05/28/2019   CO2 27 05/28/2019    Lab Results  Component Value Date   ALT 10 05/28/2019   AST 26 05/28/2019   ALKPHOS 76 05/28/2019   BILITOT 0.4 05/28/2019     Physical Exam: BP 110/70 (BP Location: Left Arm, Patient Position: Sitting, Cuff Size: Normal)   Pulse 84   Temp 97.6 F (36.4 C)   Ht 5' 7.5" (1.715 m)   Wt 185 lb 2 oz (84 kg)   BMI 28.57 kg/m  Constitutional: Pleasant,well-developed, female in no acute distress. HEENT: Normocephalic and atraumatic. Conjunctivae are normal. No scleral icterus. Neck supple.  Cardiovascular: Normal rate, regular rhythm.  Pulmonary/chest: Effort normal and breath sounds normal. No wheezing, rales or rhonchi. Abdominal: Soft, nondistended, nontender. There are no masses palpable. No hepatomegaly. Extremities: no edema Lymphadenopathy: No cervical adenopathy noted. Neurological: Alert and oriented to person place and time. Skin: Skin is warm and dry. No rashes noted. Psychiatric: Normal mood and affect. Behavior is normal.   ASSESSMENT AND PLAN: 68 year old female here for reassessment of the following issues:  Iron deficiency anemia - I suspect this may be due to inflamed gastric polyp removed during EGD.  No other concerning pathology on endoscopy or colonoscopy to cause this.  On oral iron her hemoglobin normalized.  We have since stopped her iron over the  past week and I recommend rechecking her hemoglobin and iron studies in 3 months to see if she maintains this.  If she does then we will not need to monitor this too closely moving forward.  If she has recurrence of her iron deficiency will place her back on iron and discuss whether or not we should pursue capsule endoscopy to clear her small bowel.  She agreed  GERD - on longstanding Nexium 40 mg once a day.  I discussed risks and benefits of long-term chronic PPI use.  Long-term recommend the lowest dose needed to control symptoms and transition her to H2 blocker if possible such as Pepcid.  She agreed and will reduce her dose of Nexium to 20 mg once a day for the next several weeks and see how she does.  If she does well with that she will contact me and we can discuss tapering to another regimen.  Gastric intestinal metaplasia - focal changes noted incidentally on her EGD.  She has no family history of gastric cancer or other risk factors for gastric cancer.  In this light I do not feel strongly that she warrants any surveillance endoscopy for this particular issue which we discussed.  She verbalized understanding and agreed.  [place] Cellar, MD [facility]

## 2019-10-04 NOTE — Patient Instructions (Signed)
Decrease to Nexium 20 mg daily. A new prescription has been sent to your pharmacy.   You will need repeat blood work in 3 months. We will call to remind you at that time.

## 2019-11-09 ENCOUNTER — Encounter: Payer: Self-pay | Admitting: Family Medicine

## 2019-11-11 MED ORDER — GABAPENTIN 100 MG PO CAPS: ORAL_CAPSULE | ORAL | 1 refills | Status: DC

## 2019-12-05 DIAGNOSIS — L810 Postinflammatory hyperpigmentation: Secondary | ICD-10-CM | POA: Diagnosis not present

## 2019-12-05 DIAGNOSIS — L300 Nummular dermatitis: Secondary | ICD-10-CM | POA: Diagnosis not present

## 2019-12-07 ENCOUNTER — Ambulatory Visit: Payer: Medicare Other | Attending: Internal Medicine

## 2019-12-07 DIAGNOSIS — Z23 Encounter for immunization: Secondary | ICD-10-CM

## 2019-12-07 NOTE — Progress Notes (Signed)
   Covid-19 Vaccination Clinic  Name:  Pamela Daugherty    MRN: [id number] DOB: [birth year]  12/07/2019  [name] was observed post Covid-19 immunization for  15 minutes without incidence. She was provided with Vaccine Information Sheet and instruction to access the V-Safe system.   [name] was instructed to call 911 with any severe reactions post vaccine: Marland Kitchen Difficulty breathing  . Swelling of your face and throat  . A fast heartbeat  . A bad rash all over your body  . Dizziness and weakness    Immunizations Administered    Name Date Dose VIS Date Route   Pfizer COVID-19 Vaccine 12/07/2019  9:45 AM 0.3 mL 10/04/2019 Intramuscular   Manufacturer: [place]   Lot: [id number]   [place]: [id number]

## 2019-12-27 ENCOUNTER — Telehealth: Payer: Self-pay

## 2019-12-27 DIAGNOSIS — D509 Iron deficiency anemia, unspecified: Secondary | ICD-10-CM

## 2019-12-27 NOTE — Telephone Encounter (Signed)
Patient informed and will come next week.

## 2019-12-27 NOTE — Telephone Encounter (Signed)
Left message for patient to return my call.

## 2019-12-27 NOTE — Telephone Encounter (Signed)
Patient is returning your call.  

## 2019-12-27 NOTE — Telephone Encounter (Signed)
-----   Message from Marzella Schlein, [place] sent at 10/04/2019 11:32 AM EST ----- Needs CBC, IBC/Ferritin 3 months for IDA. [place] patient. Call her and put labs in computer.

## 2019-12-29 ENCOUNTER — Ambulatory Visit: Payer: Medicare Other | Attending: Internal Medicine

## 2019-12-29 DIAGNOSIS — Z23 Encounter for immunization: Secondary | ICD-10-CM | POA: Insufficient documentation

## 2019-12-29 NOTE — Progress Notes (Signed)
   Covid-19 Vaccination Clinic  Name:  DWIGHT ROSEBROCK    MRN: [id number] DOB: 1951/05/27  12/29/2019  Ms. Knabel was observed post Covid-19 immunization for 15 minutes without incident. She was provided with Vaccine Information Sheet and instruction to access the V-Safe system.   Ms. Milward was instructed to call 911 with any severe reactions post vaccine: Marland Kitchen Difficulty breathing  . Swelling of face and throat  . A fast heartbeat  . A bad rash all over body  . Dizziness and weakness   Immunizations Administered    Name Date Dose VIS Date Route   Pfizer COVID-19 Vaccine 12/29/2019  2:06 PM 0.3 mL 10/04/2019 Intramuscular   Manufacturer: [place]   Lot: [id number]   [place]: [id number]

## 2020-01-01 ENCOUNTER — Other Ambulatory Visit (INDEPENDENT_AMBULATORY_CARE_PROVIDER_SITE_OTHER): Payer: Medicare Other

## 2020-01-01 DIAGNOSIS — D509 Iron deficiency anemia, unspecified: Secondary | ICD-10-CM | POA: Diagnosis not present

## 2020-01-01 LAB — CBC WITH DIFFERENTIAL/PLATELET
Basophils Absolute: 0 10*3/uL (ref 0.0–0.1)
Basophils Relative: 0.7 % (ref 0.0–3.0)
Eosinophils Absolute: 0.3 10*3/uL (ref 0.0–0.7)
Eosinophils Relative: 3.6 % (ref 0.0–5.0)
HCT: 37.2 % (ref 36.0–46.0)
Hemoglobin: 12.8 g/dL (ref 12.0–15.0)
Lymphocytes Relative: 38.9 % (ref 12.0–46.0)
Lymphs Abs: 2.8 10*3/uL (ref 0.7–4.0)
MCHC: 34.4 g/dL (ref 30.0–36.0)
MCV: 84.8 fl (ref 78.0–100.0)
Monocytes Absolute: 0.6 10*3/uL (ref 0.1–1.0)
Monocytes Relative: 8 % (ref 3.0–12.0)
Neutro Abs: 3.5 10*3/uL (ref 1.4–7.7)
Neutrophils Relative %: 48.8 % (ref 43.0–77.0)
Platelets: 223 10*3/uL (ref 150.0–400.0)
RBC: 4.39 Mil/uL (ref 3.87–5.11)
RDW: 13.2 % (ref 11.5–15.5)
WBC: 7.1 10*3/uL (ref 4.0–10.5)

## 2020-01-01 LAB — IBC + FERRITIN
Ferritin: 25.7 ng/mL (ref 10.0–291.0)
Iron: 56 ug/dL (ref 42–145)
Saturation Ratios: 15.2 % — ABNORMAL LOW (ref 20.0–50.0)
Transferrin: 263 mg/dL (ref 212.0–360.0)

## 2020-01-02 ENCOUNTER — Other Ambulatory Visit: Payer: Self-pay

## 2020-01-02 DIAGNOSIS — D509 Iron deficiency anemia, unspecified: Secondary | ICD-10-CM

## 2020-01-02 NOTE — Progress Notes (Signed)
Order placed for cbc.  

## 2020-01-20 ENCOUNTER — Encounter: Payer: Self-pay | Admitting: Family Medicine

## 2020-01-20 NOTE — Telephone Encounter (Signed)
Hydrochlorothiazide last filled 01/15/2019 olmesartan last filled 12/26/2017 escitalopram last filled 01/15/2019 Clobetasol last filled 04/06/2018  Last CPE 05/28/2019  Ok for refills?

## 2020-01-22 MED ORDER — ESCITALOPRAM OXALATE 20 MG PO TABS: 20.0000 mg | ORAL_TABLET | Freq: Every day | ORAL | 3 refills | Status: DC

## 2020-01-22 MED ORDER — HYDROCHLOROTHIAZIDE 12.5 MG PO CAPS: 12.5000 mg | ORAL_CAPSULE | Freq: Every day | ORAL | 3 refills | Status: DC

## 2020-01-22 MED ORDER — OLMESARTAN MEDOXOMIL-HCTZ 20-12.5 MG PO TABS: 1.0000 | ORAL_TABLET | Freq: Every day | ORAL | 3 refills | Status: DC

## 2020-01-22 MED ORDER — CLOBETASOL PROPIONATE EMULSION 0.05 % EX FOAM: 1.0000 "application " | Freq: Two times a day (BID) | CUTANEOUS | 3 refills | Status: DC

## 2020-02-13 ENCOUNTER — Encounter: Payer: Self-pay | Admitting: Family Medicine

## 2020-02-14 ENCOUNTER — Ambulatory Visit (HOSPITAL_COMMUNITY)
Admission: EM | Admit: 2020-02-14 | Discharge: 2020-02-14 | Disposition: A | Discharge status: Home / Self Care | Payer: Medicare Other | Attending: Family Medicine | Admitting: Family Medicine

## 2020-02-14 ENCOUNTER — Other Ambulatory Visit: Payer: Self-pay

## 2020-02-14 DIAGNOSIS — N3090 Cystitis, unspecified without hematuria: Secondary | ICD-10-CM

## 2020-02-14 LAB — POCT URINALYSIS DIP (DEVICE)
Bilirubin Urine: NEGATIVE
Glucose, UA: NEGATIVE mg/dL
Ketones, ur: NEGATIVE mg/dL
Nitrite: NEGATIVE
Protein, ur: 30 mg/dL — AB
Specific Gravity, Urine: 1.015 (ref 1.005–1.030)
Urobilinogen, UA: 0.2 mg/dL (ref 0.0–1.0)
pH: 6 (ref 5.0–8.0)

## 2020-02-14 MED ORDER — SULFAMETHOXAZOLE-TRIMETHOPRIM 800-160 MG PO TABS: 1.0000 | ORAL_TABLET | Freq: Two times a day (BID) | ORAL | 0 refills | Status: AC

## 2020-02-14 MED ORDER — FLUCONAZOLE 150 MG PO TABS
ORAL_TABLET | ORAL | 0 refills | Status: DC
Start: 2020-02-14 — End: 2020-11-17

## 2020-02-14 NOTE — ED Provider Notes (Signed)
[place]    ASSESSMENT & PLAN:  1. Cystitis     Tolerating PO.  Begin: Meds ordered this encounter  Medications  . sulfamethoxazole-trimethoprim (BACTRIM DS) 800-160 MG tablet    Sig: Take 1 tablet by mouth 2 (two) times daily for 5 days.    Dispense:  10 tablet    Refill:  0  . fluconazole (DIFLUCAN) 150 MG tablet    Sig: Take one tablet by mouth as a single dose. May repeat in 3 days if symptoms persist.    Dispense:  2 tablet    Refill:  0    No signs of pyelonephritis. Discussed. Requests medication for possible yeast infection. Urine culture sent. Will notify patient with any significant results. Will follow up with her PCP or here if not showing improvement over the next 48 hours, sooner if needed.  Outlined signs and symptoms indicating need for more acute intervention. Patient verbalized understanding. After Visit Summary given.  SUBJECTIVE:  Pamela Daugherty is a 69 y.o. female who complains of urinary frequency, urgency and dysuria for the past several days. Without associated fever, vaginal discharge or bleeding. Has felt chilled. Gross hematuria: not present. No specific aggravating or alleviating factors reported. No LE edema. Normal PO intake without n/v/d. Without specific abdominal pain. Ambulatory without difficulty. OTC treatment: AZO without much relief. H/O UTI: reports frequent; would like name of urologist.  LMP: No LMP recorded. Patient is postmenopausal.    OBJECTIVE:  Vitals:   02/14/20 1012  BP: 132/75  Pulse: 87  Resp: 16  Temp: 97.8 F (36.6 C)  SpO2: 98%   General appearance: alert; no distress HENT: oropharynx: moist Lungs: unlabored respirations Abdomen: soft, non-tender; no masses or organomegaly; no guarding or rebound tenderness Back: no CVA tenderness Extremities: no edema; symmetrical with no gross deformities Skin: warm and dry Neurologic: normal gait Psychological: alert and cooperative; normal mood and  affect  Labs Reviewed  POCT URINALYSIS DIP (DEVICE) - Abnormal; Notable for the following components:      Result Value   Hgb urine dipstick MODERATE (*)    Protein, ur 30 (*)    Leukocytes,Ua SMALL (*)    All other components within normal limits    Allergies  Allergen Reactions  . Penicillins     rash    Past Medical History:  Diagnosis Date  . Anemia   . Anxiety   . B12 deficiency   . Depression   . Esophageal reflux   . Esophageal stricture   . Fatty liver   . Hypertension   . IBS (irritable bowel syndrome)   . Insomnia   . Neck pain   . Routine gynecological examination    sees Dr. Alden Hipp    Social History   Socioeconomic History  . Marital status: Widowed    Spouse name: Not on file  . Number of children: Not on file  . Years of education: Not on file  . Highest education level: Not on file  Occupational History  . Occupation: adjuster  Tobacco Use  . Smoking status: Never Smoker  . Smokeless tobacco: Never Used  Substance and Sexual Activity  . Alcohol use: Yes    Alcohol/week: 2.0 standard drinks    Types: 2 Standard drinks or equivalent per week  . Drug use: No  . Sexual activity: Not on file  Other Topics Concern  . Not on file  Social History Narrative  . Not on file   Social Determinants  of Health   Financial Resource Strain:   . Difficulty of Paying Living Expenses:   Food Insecurity:   . Worried About Charity fundraiser in the Last Year:   . Arboriculturist in the Last Year:   Transportation Needs:   . Film/video editor (Medical):   Marland Kitchen Lack of Transportation (Non-Medical):   Physical Activity:   . Days of Exercise per Week:   . Minutes of Exercise per Session:   Stress:   . Feeling of Stress :   Social Connections:   . Frequency of Communication with Friends and Family:   . Frequency of Social Gatherings with Friends and Family:   . Attends Religious Services:   . Active Member of Clubs or Organizations:   .  Attends Archivist Meetings:   Marland Kitchen Marital Status:   Intimate Partner Violence:   . Fear of Current or Ex-Partner:   . Emotionally Abused:   Marland Kitchen Physically Abused:   . Sexually Abused:    Family History  Problem Relation Age of Onset  . Coronary artery disease Mother   . Diabetes Mother   . Irritable bowel syndrome Mother   . Colon cancer Neg Hx   . Esophageal cancer Neg Hx   . Rectal cancer Neg Hx   . Stomach cancer Neg Hx        Vanessa Kick, MD 02/14/20 [id number]

## 2020-02-14 NOTE — ED Triage Notes (Signed)
Pt c/o LLQ abd pain, urinary urgency and low back pain x 1 week

## 2020-02-21 DIAGNOSIS — N3020 Other chronic cystitis without hematuria: Secondary | ICD-10-CM | POA: Diagnosis not present

## 2020-02-21 DIAGNOSIS — N952 Postmenopausal atrophic vaginitis: Secondary | ICD-10-CM | POA: Diagnosis not present

## 2020-05-22 DIAGNOSIS — N952 Postmenopausal atrophic vaginitis: Secondary | ICD-10-CM | POA: Diagnosis not present

## 2020-05-22 DIAGNOSIS — N3021 Other chronic cystitis with hematuria: Secondary | ICD-10-CM | POA: Diagnosis not present

## 2020-05-23 DIAGNOSIS — N3021 Other chronic cystitis with hematuria: Secondary | ICD-10-CM | POA: Diagnosis not present

## 2020-06-04 DIAGNOSIS — N952 Postmenopausal atrophic vaginitis: Secondary | ICD-10-CM | POA: Diagnosis not present

## 2020-06-04 DIAGNOSIS — N3021 Other chronic cystitis with hematuria: Secondary | ICD-10-CM | POA: Diagnosis not present

## 2020-06-05 ENCOUNTER — Encounter: Payer: Self-pay | Admitting: Family Medicine

## 2020-06-05 ENCOUNTER — Other Ambulatory Visit: Payer: Self-pay

## 2020-06-05 ENCOUNTER — Ambulatory Visit (INDEPENDENT_AMBULATORY_CARE_PROVIDER_SITE_OTHER): Payer: Medicare Other | Admitting: Family Medicine

## 2020-06-05 VITALS — BP 120/70 | HR 74 | Temp 98.0°F | Ht 67.5 in | Wt 185.6 lb

## 2020-06-05 DIAGNOSIS — F411 Generalized anxiety disorder: Secondary | ICD-10-CM

## 2020-06-05 DIAGNOSIS — K581 Irritable bowel syndrome with constipation: Secondary | ICD-10-CM | POA: Diagnosis not present

## 2020-06-05 DIAGNOSIS — K219 Gastro-esophageal reflux disease without esophagitis: Secondary | ICD-10-CM | POA: Diagnosis not present

## 2020-06-05 DIAGNOSIS — D509 Iron deficiency anemia, unspecified: Secondary | ICD-10-CM | POA: Diagnosis not present

## 2020-06-05 DIAGNOSIS — I10 Essential (primary) hypertension: Secondary | ICD-10-CM | POA: Diagnosis not present

## 2020-06-05 LAB — CBC WITH DIFFERENTIAL/PLATELET
Absolute Monocytes: 453 cells/uL (ref 200–950)
Basophils Absolute: 50 cells/uL (ref 0–200)
Basophils Relative: 0.8 %
Eosinophils Absolute: 205 cells/uL (ref 15–500)
Eosinophils Relative: 3.3 %
HCT: 41.9 % (ref 35.0–45.0)
Hemoglobin: 13.8 g/dL (ref 11.7–15.5)
Lymphs Abs: 2306 cells/uL (ref 850–3900)
MCH: 28.6 pg (ref 27.0–33.0)
MCHC: 32.9 g/dL (ref 32.0–36.0)
MCV: 86.9 fL (ref 80.0–100.0)
MPV: 10.7 fL (ref 7.5–12.5)
Monocytes Relative: 7.3 %
Neutro Abs: 3187 cells/uL (ref 1500–7800)
Neutrophils Relative %: 51.4 %
Platelets: 209 10*3/uL (ref 140–400)
RBC: 4.82 10*6/uL (ref 3.80–5.10)
RDW: 13.1 % (ref 11.0–15.0)
Total Lymphocyte: 37.2 %
WBC: 6.2 10*3/uL (ref 3.8–10.8)

## 2020-06-05 LAB — BASIC METABOLIC PANEL
BUN: 14 mg/dL (ref 7–25)
CO2: 30 mmol/L (ref 20–32)
Calcium: 9.6 mg/dL (ref 8.6–10.4)
Chloride: 102 mmol/L (ref 98–110)
Creat: 0.7 mg/dL (ref 0.50–0.99)
Glucose, Bld: 92 mg/dL (ref 65–99)
Potassium: 4.4 mmol/L (ref 3.5–5.3)
Sodium: 140 mmol/L (ref 135–146)

## 2020-06-05 LAB — HEPATIC FUNCTION PANEL
AG Ratio: 2 (calc) (ref 1.0–2.5)
ALT: 11 U/L (ref 6–29)
AST: 18 U/L (ref 10–35)
Albumin: 4.3 g/dL (ref 3.6–5.1)
Alkaline phosphatase (APISO): 69 U/L (ref 37–153)
Bilirubin, Direct: 0.1 mg/dL (ref 0.0–0.2)
Globulin: 2.2 g/dL (calc) (ref 1.9–3.7)
Indirect Bilirubin: 0.3 mg/dL (calc) (ref 0.2–1.2)
Total Bilirubin: 0.4 mg/dL (ref 0.2–1.2)
Total Protein: 6.5 g/dL (ref 6.1–8.1)

## 2020-06-05 LAB — LIPID PANEL
Cholesterol: 169 mg/dL (ref <200)
HDL: 47 mg/dL — ABNORMAL LOW (ref >=50)
LDL Cholesterol (Calc): 100 mg/dL (calc) — ABNORMAL HIGH
Non-HDL Cholesterol (Calc): 122 mg/dL (calc) (ref <130)
Total CHOL/HDL Ratio: 3.6 (calc) (ref <5.0)
Triglycerides: 127 mg/dL (ref <150)

## 2020-06-05 LAB — TSH: TSH: 2.53 mIU/L (ref 0.40–4.50)

## 2020-06-05 LAB — IRON: Iron: 61 ug/dL (ref 45–160)

## 2020-06-05 MED ORDER — GABAPENTIN 300 MG PO CAPS
300.0000 mg | ORAL_CAPSULE | Freq: Every day | ORAL | 3 refills | Status: DC
Start: 2020-06-05 — End: 2021-02-12

## 2020-06-05 NOTE — Progress Notes (Signed)
Subjective:    Patient ID: Pamela Daugherty, female    DOB: 08/20/1951, 69 y.o.   MRN: [id number]  HPI Here to follow up issues. Her main concern today is burning and tingling in her feet. She had been taking a total of 400 mg a day of Gabapentin, but in the past few months she has cut this back to 100 mg at night only. She is seeing Dr. Lovena Neighbours at [facility] for recurrent UTIs. He has started her on an estrogen vaginal cream.  She has had a cystoscopy and a CT scan is pending. She had upper and lower endoscopy last year per Dr. Havery Moros for iron deficiency anemin, and the source of bleeding was found to be a gastric polyp. This was removed. Pathology was all benign.    Review of Systems  Constitutional: Negative.   HENT: Negative.   Eyes: Negative.   Respiratory: Negative.   Cardiovascular: Negative.   Gastrointestinal: Negative.   Genitourinary: Negative for decreased urine volume, difficulty urinating, dyspareunia, dysuria, enuresis, flank pain, frequency, hematuria, pelvic pain and urgency.  Musculoskeletal: Negative.   Skin: Negative.   Neurological: Negative.   Psychiatric/Behavioral: Negative.        Objective:   Physical Exam Constitutional:      General: She is not in acute distress.    Appearance: She is well-developed.  HENT:     Head: Normocephalic and atraumatic.     Right Ear: External ear normal.     Left Ear: External ear normal.     Nose: Nose normal.     Mouth/Throat:     Pharynx: No oropharyngeal exudate.  Eyes:     General: No scleral icterus.    Conjunctiva/sclera: Conjunctivae normal.     Pupils: Pupils are equal, round, and reactive to light.  Neck:     Thyroid: No thyromegaly.     Vascular: No JVD.  Cardiovascular:     Rate and Rhythm: Normal rate and regular rhythm.     Heart sounds: Normal heart sounds. No murmur heard.  No friction rub. No gallop.   Pulmonary:     Effort: Pulmonary effort is normal. No respiratory distress.     Breath  sounds: Normal breath sounds. No wheezing or rales.  Chest:     Chest wall: No tenderness.  Abdominal:     General: Bowel sounds are normal. There is no distension.     Palpations: Abdomen is soft. There is no mass.     Tenderness: There is no abdominal tenderness. There is no guarding or rebound.  Musculoskeletal:        General: No tenderness. Normal range of motion.     Cervical back: Normal range of motion and neck supple.  Lymphadenopathy:     Cervical: No cervical adenopathy.  Skin:    General: Skin is warm and dry.     Findings: No erythema or rash.  Neurological:     Mental Status: She is alert and oriented to person, place, and time.     Cranial Nerves: No cranial nerve deficit.     Motor: No abnormal muscle tone.     Coordination: Coordination normal.     Deep Tendon Reflexes: Reflexes are normal and symmetric. Reflexes normal.  Psychiatric:        Behavior: Behavior normal.        Thought Content: Thought content normal.        Judgment: Judgment normal.  Assessment & Plan:  Her HTN and anxiety and GERD are stable. She will follow up with Dr. Lovena Neighbours for the UTIs. Get fasting labs for lipids, etc. Check a Hgb and iron level. For the neuropathys he will increase the bedtime dose of Gabapentin to 300 mg. Alysia Penna, MD

## 2020-06-22 DIAGNOSIS — I700 Atherosclerosis of aorta: Secondary | ICD-10-CM | POA: Diagnosis not present

## 2020-06-22 DIAGNOSIS — N3021 Other chronic cystitis with hematuria: Secondary | ICD-10-CM | POA: Diagnosis not present

## 2020-06-22 DIAGNOSIS — K8020 Calculus of gallbladder without cholecystitis without obstruction: Secondary | ICD-10-CM | POA: Diagnosis not present

## 2020-06-22 DIAGNOSIS — K449 Diaphragmatic hernia without obstruction or gangrene: Secondary | ICD-10-CM | POA: Diagnosis not present

## 2020-06-22 DIAGNOSIS — K5730 Diverticulosis of large intestine without perforation or abscess without bleeding: Secondary | ICD-10-CM | POA: Diagnosis not present

## 2020-07-10 ENCOUNTER — Ambulatory Visit (INDEPENDENT_AMBULATORY_CARE_PROVIDER_SITE_OTHER): Payer: Medicare Other

## 2020-07-10 ENCOUNTER — Other Ambulatory Visit: Payer: Self-pay

## 2020-07-10 DIAGNOSIS — Z780 Asymptomatic menopausal state: Secondary | ICD-10-CM | POA: Diagnosis not present

## 2020-07-10 DIAGNOSIS — Z0000 Encounter for general adult medical examination without abnormal findings: Secondary | ICD-10-CM | POA: Diagnosis not present

## 2020-07-10 NOTE — Progress Notes (Addendum)
Subjective:   Pamela Daugherty is a 69 y.o. female who presents for an Initial Medicare Annual Wellness Visit.  I connected with Pamela Daugherty today by telephone and verified that I am speaking with the correct person using two identifiers. Location patient: home Location provider: work Persons participating in the virtual visit: patient, provider.   I discussed the limitations, risks, security and privacy concerns of performing an evaluation and management service by telephone and the availability of in person appointments. I also discussed with the patient that there may be a patient responsible charge related to this service. The patient expressed understanding and verbally consented to this telephonic visit.    Interactive audio and video telecommunications were attempted between this provider and patient, however failed, due to patient having technical difficulties OR patient did not have access to video capability.  We continued and completed visit with audio only.      Review of Systems    N/A Cardiac Risk Factors include: advanced age (>21men, >64 women);hypertension     Objective:    Today's Vitals   There is no height or weight on file to calculate BMI.  Advanced Directives 07/10/2020  Does Patient Have a Medical Advance Directive? No  Would patient like information on creating a medical advance directive? No - Patient declined    Current Medications (verified) Outpatient Encounter Medications as of 07/10/2020  Medication Sig  . ALPRAZolam (XANAX) 0.5 MG tablet Take 0.25 mg by mouth at bedtime as needed.  Marland Kitchen aspirin 81 MG EC tablet Take 81 mg by mouth daily.    . Clobetasol Propionate Emulsion 0.05 % topical foam Apply 1 application topically 2 (two) times daily.  Marland Kitchen escitalopram (LEXAPRO) 20 MG tablet Take 1 tablet (20 mg total) by mouth daily.  Marland Kitchen estradiol (ESTRACE) 0.1 MG/GM vaginal cream SMARTSIG:Gram(s) Vaginal Daily  . fish oil-omega-3 fatty acids 1000 MG capsule  Take 1 g by mouth daily.    Marland Kitchen gabapentin (NEURONTIN) 300 MG capsule Take 1 capsule (300 mg total) by mouth at bedtime.  . hydrochlorothiazide (MICROZIDE) 12.5 MG capsule Take 1 capsule (12.5 mg total) by mouth daily.  . Linaclotide (LINZESS) 145 MCG CAPS Take 145 mcg by mouth daily as needed.  . meclizine (ANTIVERT) 25 MG tablet Take 1 tablet (25 mg total) by mouth every 4 (four) hours as needed for dizziness.  Marland Kitchen olmesartan-hydrochlorothiazide (BENICAR HCT) 20-12.5 MG tablet Take 1 tablet by mouth daily.  . valACYclovir (VALTREX) 500 MG tablet Take 1,000 mg by mouth 2 (two) times daily.  . vitamin B-12 (CYANOCOBALAMIN) 250 MCG tablet Take 250 mcg by mouth daily.  . vitamin E 600 UNIT capsule Take 600 Units by mouth daily.    Marland Kitchen esomeprazole (NEXIUM) 20 MG capsule Take 1 capsule (20 mg total) by mouth daily at 12 noon. (Patient not taking: Reported on 07/10/2020)  . fluconazole (DIFLUCAN) 150 MG tablet Take one tablet by mouth as a single dose. May repeat in 3 days if symptoms persist. (Patient not taking: Reported on 07/10/2020)  . OVER THE COUNTER MEDICATION Hair, nail and skin vitamin, One tablet three times daily.   No facility-administered encounter medications on file as of 07/10/2020.    Allergies (verified) Penicillins   History: Past Medical History:  Diagnosis Date  . Anemia   . Anxiety   . B12 deficiency   . Depression   . Esophageal reflux   . Esophageal stricture   . Fatty liver   . Hypertension   . IBS (  irritable bowel syndrome)   . Insomnia   . Neck pain   . Routine gynecological examination    sees Dr. Alden Hipp    Past Surgical History:  Procedure Laterality Date  . APPENDECTOMY    . COLONOSCOPY  06/28/2019   per Dr. Havery Moros, benign polyps, repeat in 10 yrs   . ESOPHAGOGASTRODUODENOSCOPY  06/28/2019   per Dr. Havery Moros, benign gastric polyp, source of GI blood loss   . TONSILLECTOMY    . TUBAL LIGATION     Family History  Problem Relation Age of  Onset  . Coronary artery disease Mother   . Diabetes Mother   . Irritable bowel syndrome Mother   . Colon cancer Neg Hx   . Esophageal cancer Neg Hx   . Rectal cancer Neg Hx   . Stomach cancer Neg Hx    Social History   Socioeconomic History  . Marital status: Widowed    Spouse name: Not on file  . Number of children: Not on file  . Years of education: Not on file  . Highest education level: Not on file  Occupational History  . Occupation: adjuster  Tobacco Use  . Smoking status: Never Smoker  . Smokeless tobacco: Never Used  Substance and Sexual Activity  . Alcohol use: Yes    Alcohol/week: 2.0 standard drinks    Types: 2 Standard drinks or equivalent per week  . Drug use: No  . Sexual activity: Not on file  Other Topics Concern  . Not on file  Social History Narrative  . Not on file   Social Determinants of Health   Financial Resource Strain: Low Risk   . Difficulty of Paying Living Expenses: Not hard at all  Food Insecurity: No Food Insecurity  . Worried About Charity fundraiser in the Last Year: Never true  . Ran Out of Food in the Last Year: Never true  Transportation Needs: No Transportation Needs  . Lack of Transportation (Medical): No  . Lack of Transportation (Non-Medical): No  Physical Activity: Inactive  . Days of Exercise per Week: 0 days  . Minutes of Exercise per Session: 0 min  Stress: No Stress Concern Present  . Feeling of Stress : Not at all  Social Connections: Moderately Isolated  . Frequency of Communication with Friends and Family: More than three times a week  . Frequency of Social Gatherings with Friends and Family: More than three times a week  . Attends Religious Services: More than 4 times per year  . Active Member of Clubs or Organizations: No  . Attends Archivist Meetings: Never  . Marital Status: Widowed    Tobacco Counseling Counseling given: Not Answered   Clinical Intake:  Pre-visit preparation completed:  Yes  Pain : No/denies pain     Nutritional Risks: None Diabetes: No  How often do you need to have someone help you when you read instructions, pamphlets, or other written materials from your doctor or pharmacy?: 1 - Never What is the last grade level you completed in school?: High School graduate  Diabetic?No  Interpreter Needed?: No  Information entered by :: [place] of Daily Living In your present state of health, do you have any difficulty performing the following activities: 07/10/2020  Hearing? Y  Comment Has difficulties hearing in both ears  Vision? N  Difficulty concentrating or making decisions? N  Walking or climbing stairs? N  Dressing or bathing? N  Doing errands, shopping? N  Preparing Food and eating ? N  Using the Toilet? N  In the past six months, have you accidently leaked urine? N  Do you have problems with loss of bowel control? N  Managing your Medications? N  Managing your Finances? N  Housekeeping or managing your Housekeeping? N  Some recent data might be hidden    Patient Care Team: Laurey Morale, MD as PCP - General  Indicate any recent Medical Services you may have received from other than [facility] providers in the past year (date may be approximate).     Assessment:   This is a routine wellness examination for Pamela Daugherty.  Hearing/Vision screen  Hearing Screening   125Hz  250Hz  500Hz  1000Hz  2000Hz  3000Hz  4000Hz  6000Hz  8000Hz   Right ear:           Left ear:           Vision Screening Comments: Patient states gets eyes checked every 2 yrs   Dietary issues and exercise activities discussed: Current Exercise Habits: The patient does not participate in regular exercise at present  Goals    . Exercise 150 min/wk Moderate Activity      Depression Screen PHQ 2/9 Scores 07/10/2020  PHQ - 2 Score 0  PHQ- 9 Score 0    Fall Risk Fall Risk  07/10/2020 12/26/2017  Falls in the past year? 0 No  Number falls in past yr: 0 -  Injury  with Fall? 0 -  Follow up Falls evaluation completed;Falls prevention discussed -    Any stairs in or around the home? No  If so, are there any without handrails? No  Home free of loose throw rugs in walkways, pet beds, electrical cords, etc? Yes  Adequate lighting in your home to reduce risk of falls? Yes   ASSISTIVE DEVICES UTILIZED TO PREVENT FALLS:  Life alert? No  Use of a cane, walker or w/c? No  Grab bars in the bathroom? No  Shower chair or bench in shower? No  Elevated toilet seat or a handicapped toilet? Yes    Cognitive Function:  cognitive screening not indicated based on direct observation      Immunizations Immunization History  Administered Date(s) Administered  . Fluad Quad(high Dose 65+) 08/31/2019  . Influenza, High Dose Seasonal PF 11/29/2016, 12/26/2017, 11/06/2018  . Influenza,inj,Quad PF,6+ Mos 10/11/2013, 10/13/2014, 10/21/2015  . Influenza-Unspecified 11/06/2018  . PFIZER SARS-COV-2 Vaccination 12/07/2019, 12/29/2019  . Pneumococcal Conjugate-13 12/26/2017  . Zoster Recombinat (Shingrix) 05/14/2018    TDAP status: Due, Education has been provided regarding the importance of this vaccine. Advised may receive this vaccine at local pharmacy or Health Dept. Aware to provide a copy of the vaccination record if obtained from local pharmacy or Health Dept. Verbalized acceptance and understanding. Flu Vaccine status: Up to date Pneumococcal vaccine status: Declined,  Education has been provided regarding the importance of this vaccine but patient still declined. Advised may receive this vaccine at local pharmacy or Health Dept. Aware to provide a copy of the vaccination record if obtained from local pharmacy or Health Dept. Verbalized acceptance and understanding.  Covid-19 vaccine status: Completed vaccines  Qualifies for Shingles Vaccine? Yes   Zostavax completed No   Shingrix Completed?: Yes  Screening Tests Health Maintenance  Topic Date Due  .  Hepatitis C Screening  Never done  . TETANUS/TDAP  Never done  . DEXA SCAN  Never done  . PNA vac Low Risk Adult (2 of 2 - PPSV23) 12/27/2018  . INFLUENZA VACCINE  05/24/2020  . MAMMOGRAM  09/08/2021  . COLONOSCOPY  06/27/2029  . COVID-19 Vaccine  Completed    Health Maintenance  Health Maintenance Due  Topic Date Due  . Hepatitis C Screening  Never done  . TETANUS/TDAP  Never done  . DEXA SCAN  Never done  . PNA vac Low Risk Adult (2 of 2 - PPSV23) 12/27/2018  . INFLUENZA VACCINE  05/24/2020    Colorectal cancer screening: Completed 06/28/2019. Repeat every 10 years Mammogram status: Completed 09/09/2019. Repeat every year Bone Density status: Ordered 07/10/2020. Pt provided with contact info and advised to call to schedule appt.  Lung Cancer Screening: (Low Dose CT Chest recommended if Age 59-80 years, 30 pack-year currently smoking OR have quit w/in 15years.) does not qualify.   Lung Cancer Screening Referral: N/A  Additional Screening:  Hepatitis C Screening: does qualify; Completed  Vision Screening: Recommended annual ophthalmology exams for early detection of glaucoma and other disorders of the eye. Is the patient up to date with their annual eye exam?  No  Who is the provider or what is the name of the office in which the patient attends annual eye exams? Dr. Bing Plume If pt is not established with a provider, would they like to be referred to a provider to establish care? No .   Dental Screening: Recommended annual dental exams for proper oral hygiene  Community Resource Referral / Chronic Care Management: CRR required this visit?  No   CCM required this visit?  No      Plan:     I have personally reviewed and noted the following in the patient's chart:   . Medical and social history . Use of alcohol, tobacco or illicit drugs  . Current medications and supplements . Functional ability and status . Nutritional status . Physical activity . Advanced  directives . List of other physicians . Hospitalizations, surgeries, and ER visits in previous 12 months . Vitals . Screenings to include cognitive, depression, and falls . Referrals and appointments  In addition, I have reviewed and discussed with patient certain preventive protocols, quality metrics, and best practice recommendations. A written personalized care plan for preventive services as well as general preventive health recommendations were provided to patient.     Ofilia Neas, LPN   4/43/1540   Nurse Notes: None I have read this note and agree with its contents.  Alysia Penna, MD

## 2020-07-10 NOTE — Patient Instructions (Signed)
[name] , Thank you for taking time to come for your Medicare Wellness Visit. I appreciate your ongoing commitment to your health goals. Please review the following plan we discussed and let me know if I can assist you in the future.   Screening recommendations/referrals: Colonoscopy: Up to date, next due 06/26/2029 Mammogram: Up to date, next due 09/08/2020 Bone Density: Currently due, orders placed this visit Recommended yearly ophthalmology/optometry visit for glaucoma screening and checkup Recommended yearly dental visit for hygiene and checkup  Vaccinations: Influenza vaccine: Currently due for flu vaccine of 2021-2022 flu season you may receive in our office or at your local pharmacy Pneumococcal vaccine: Currently due for Pneumovax 23, you may receive this at your next office visit Tdap vaccine: Currently due, you may contact your insurance to discuss cost or await and injury to receive  Shingles vaccine: Completed series    Advanced directives: Advance directive discussed with you today. Even though you declined this today please call our office should you change your mind and we can give you the proper paperwork for you to fill out.   Conditions/risks identified: None   Next appointment: 07/16/2021 @ 10:30 am with Pamela Daugherty 65 Years and Older, Female Preventive care refers to lifestyle choices and visits with your health care provider that can promote health and wellness. What does preventive care include?  A yearly physical exam. This is also called an annual well check.  Dental exams once or twice a year.  Routine eye exams. Ask your health care provider how often you should have your eyes checked.  Personal lifestyle choices, including:  Daily care of your teeth and gums.  Regular physical activity.  Eating a healthy diet.  Avoiding tobacco and drug use.  Limiting alcohol use.  Practicing safe sex.  Taking low-dose  aspirin every day.  Taking vitamin and mineral supplements as recommended by your health care provider. What happens during an annual well check? The services and screenings done by your health care provider during your annual well check will depend on your age, overall health, lifestyle risk factors, and family history of disease. Counseling  Your health care provider may ask you questions about your:  Alcohol use.  Tobacco use.  Drug use.  Emotional well-being.  Home and relationship well-being.  Sexual activity.  Eating habits.  History of falls.  Memory and ability to understand (cognition).  Work and work Statistician.  Reproductive health. Screening  You may have the following tests or measurements:  Height, weight, and BMI.  Blood pressure.  Lipid and cholesterol levels. These may be checked every 5 years, or more frequently if you are over 24 years old.  Skin check.  Lung cancer screening. You may have this screening every year starting at age 9 if you have a 30-pack-year history of smoking and currently smoke or have quit within the past 15 years.  Fecal occult blood test (FOBT) of the stool. You may have this test every year starting at age 34.  Flexible sigmoidoscopy or colonoscopy. You may have a sigmoidoscopy every 5 years or a colonoscopy every 10 years starting at age 3.  Hepatitis C blood test.  Hepatitis B blood test.  Sexually transmitted disease (STD) testing.  Diabetes screening. This is done by checking your blood sugar (glucose) after you have not eaten for a while (fasting). You may have this done every 1-3 years.  Bone density scan. This is done to screen for  osteoporosis. You may have this done starting at age 78.  Mammogram. This may be done every 1-2 years. Talk to your health care provider about how often you should have regular mammograms. Talk with your health care provider about your test results, treatment options, and if  necessary, the need for more tests. Vaccines  Your health care provider may recommend certain vaccines, such as:  Influenza vaccine. This is recommended every year.  Tetanus, diphtheria, and acellular pertussis (Tdap, Td) vaccine. You may need a Td booster every 10 years.  Zoster vaccine. You may need this after age 35.  Pneumococcal 13-valent conjugate (PCV13) vaccine. One dose is recommended after age 2.  Pneumococcal polysaccharide (PPSV23) vaccine. One dose is recommended after age 11. Talk to your health care provider about which screenings and vaccines you need and how often you need them. This information is not intended to replace advice given to you by your health care provider. Make sure you discuss any questions you have with your health care provider. Document Released: 11/06/2015 Document Revised: 06/29/2016 Document Reviewed: 08/11/2015 Elsevier Interactive Patient Education  2017 [employer] Prevention in the Home Falls can cause injuries. They can happen to people of all ages. There are many things you can do to make your home safe and to help prevent falls. What can I do on the outside of my home?  Regularly fix the edges of walkways and driveways and fix any cracks.  Remove anything that might make you trip as you walk through a door, such as a raised step or threshold.  Trim any bushes or trees on the path to your home.  Use bright outdoor lighting.  Clear any walking paths of anything that might make someone trip, such as rocks or tools.  Regularly check to see if handrails are loose or broken. Make sure that both sides of any steps have handrails.  Any raised decks and porches should have guardrails on the edges.  Have any leaves, snow, or ice cleared regularly.  Use sand or salt on walking paths during winter.  Clean up any spills in your garage right away. This includes oil or grease spills. What can I do in the bathroom?  Use night  lights.  Install grab bars by the toilet and in the tub and shower. Do not use towel bars as grab bars.  Use non-skid mats or decals in the tub or shower.  If you need to sit down in the shower, use a plastic, non-slip stool.  Keep the floor dry. Clean up any water that spills on the floor as soon as it happens.  Remove soap buildup in the tub or shower regularly.  Attach bath mats securely with double-sided non-slip rug tape.  Do not have throw rugs and other things on the floor that can make you trip. What can I do in the bedroom?  Use night lights.  Make sure that you have a light by your bed that is easy to reach.  Do not use any sheets or blankets that are too big for your bed. They should not hang down onto the floor.  Have a firm chair that has side arms. You can use this for support while you get dressed.  Do not have throw rugs and other things on the floor that can make you trip. What can I do in the kitchen?  Clean up any spills right away.  Avoid walking on wet floors.  Keep items that you use  a lot in easy-to-reach places.  If you need to reach something above you, use a strong step stool that has a grab bar.  Keep electrical cords out of the way.  Do not use floor polish or wax that makes floors slippery. If you must use wax, use non-skid floor wax.  Do not have throw rugs and other things on the floor that can make you trip. What can I do with my stairs?  Do not leave any items on the stairs.  Make sure that there are handrails on both sides of the stairs and use them. Fix handrails that are broken or loose. Make sure that handrails are as long as the stairways.  Check any carpeting to make sure that it is firmly attached to the stairs. Fix any carpet that is loose or worn.  Avoid having throw rugs at the top or bottom of the stairs. If you do have throw rugs, attach them to the floor with carpet tape.  Make sure that you have a light switch at the  top of the stairs and the bottom of the stairs. If you do not have them, ask someone to add them for you. What else can I do to help prevent falls?  Wear shoes that:  Do not have high heels.  Have rubber bottoms.  Are comfortable and fit you well.  Are closed at the toe. Do not wear sandals.  If you use a stepladder:  Make sure that it is fully opened. Do not climb a closed stepladder.  Make sure that both sides of the stepladder are locked into place.  Ask someone to hold it for you, if possible.  Clearly mark and make sure that you can see:  Any grab bars or handrails.  First and last steps.  Where the edge of each step is.  Use tools that help you move around (mobility aids) if they are needed. These include:  Canes.  Walkers.  Scooters.  Crutches.  Turn on the lights when you go into a dark area. Replace any light bulbs as soon as they burn out.  Set up your furniture so you have a clear path. Avoid moving your furniture around.  If any of your floors are uneven, fix them.  If there are any pets around you, be aware of where they are.  Review your medicines with your doctor. Some medicines can make you feel dizzy. This can increase your chance of falling. Ask your doctor what other things that you can do to help prevent falls. This information is not intended to replace advice given to you by your health care provider. Make sure you discuss any questions you have with your health care provider. Document Released: 08/06/2009 Document Revised: 03/17/2016 Document Reviewed: 11/14/2014 Elsevier Interactive Patient Education  2017 [employer].

## 2020-07-20 ENCOUNTER — Encounter: Payer: Self-pay | Admitting: Family Medicine

## 2020-07-24 MED ORDER — MECLIZINE HCL 25 MG PO TABS: 25.0000 mg | ORAL_TABLET | ORAL | 3 refills | Status: AC | PRN

## 2020-07-24 NOTE — Telephone Encounter (Signed)
These were sent in  

## 2020-08-31 ENCOUNTER — Ambulatory Visit (INDEPENDENT_AMBULATORY_CARE_PROVIDER_SITE_OTHER): Payer: Medicare Other | Admitting: *Deleted

## 2020-08-31 ENCOUNTER — Other Ambulatory Visit: Payer: Self-pay

## 2020-08-31 DIAGNOSIS — Z23 Encounter for immunization: Secondary | ICD-10-CM | POA: Diagnosis not present

## 2020-09-09 ENCOUNTER — Encounter: Payer: Self-pay | Admitting: Family Medicine

## 2020-09-09 DIAGNOSIS — Z1231 Encounter for screening mammogram for malignant neoplasm of breast: Secondary | ICD-10-CM | POA: Diagnosis not present

## 2020-10-01 DIAGNOSIS — Z872 Personal history of diseases of the skin and subcutaneous tissue: Secondary | ICD-10-CM | POA: Diagnosis not present

## 2020-10-01 DIAGNOSIS — L821 Other seborrheic keratosis: Secondary | ICD-10-CM | POA: Diagnosis not present

## 2020-10-01 DIAGNOSIS — L814 Other melanin hyperpigmentation: Secondary | ICD-10-CM | POA: Diagnosis not present

## 2020-10-01 DIAGNOSIS — D225 Melanocytic nevi of trunk: Secondary | ICD-10-CM | POA: Diagnosis not present

## 2020-10-01 DIAGNOSIS — L905 Scar conditions and fibrosis of skin: Secondary | ICD-10-CM | POA: Diagnosis not present

## 2020-10-01 DIAGNOSIS — D1801 Hemangioma of skin and subcutaneous tissue: Secondary | ICD-10-CM | POA: Diagnosis not present

## 2020-10-01 DIAGNOSIS — L218 Other seborrheic dermatitis: Secondary | ICD-10-CM | POA: Diagnosis not present

## 2020-10-24 ENCOUNTER — Encounter: Payer: Self-pay | Admitting: Family Medicine

## 2020-10-26 ENCOUNTER — Other Ambulatory Visit: Payer: Medicare Other

## 2020-11-17 ENCOUNTER — Telehealth (INDEPENDENT_AMBULATORY_CARE_PROVIDER_SITE_OTHER): Payer: Medicare Other | Admitting: Family Medicine

## 2020-11-17 ENCOUNTER — Encounter: Payer: Self-pay | Admitting: Family Medicine

## 2020-11-17 VITALS — Wt 185.0 lb

## 2020-11-17 DIAGNOSIS — B349 Viral infection, unspecified: Secondary | ICD-10-CM

## 2020-11-17 NOTE — Progress Notes (Signed)
Subjective:    Patient ID: Pamela Daugherty, female    DOB: [birth year], 70 y.o.   MRN: [id number]  HPI Virtual Visit via Video Note  I connected with the patient on 11/17/20 at 10:15 AM EST by a video enabled telemedicine application and verified that I am speaking with the correct person using two identifiers.  Location patient: home Location provider:work or home office Persons participating in the virtual visit: patient, provider  I discussed the limitations of evaluation and management by telemedicine and the availability of in person appointments. The patient expressed understanding and agreed to proceed.   HPI: Here for 3 days of body aches, ST, a dry cough, and diarrhea. No fever or SOB. No nausea or vomiting. Drinking fluids and taking Mucinex and Tylenol.    ROS: See pertinent positives and negatives per HPI.  Past Medical History:  Diagnosis Date  . Anemia   . Anxiety   . B12 deficiency   . Depression   . Esophageal reflux   . Esophageal stricture   . Fatty liver   . Hypertension   . IBS (irritable bowel syndrome)   . Insomnia   . Neck pain   . Routine gynecological examination    sees Dr. Alden Hipp     Past Surgical History:  Procedure Laterality Date  . APPENDECTOMY    . COLONOSCOPY  06/28/2019   per Dr. Havery Moros, benign polyps, repeat in 10 yrs   . ESOPHAGOGASTRODUODENOSCOPY  06/28/2019   per Dr. Havery Moros, benign gastric polyp, source of GI blood loss   . TONSILLECTOMY    . TUBAL LIGATION      Family History  Problem Relation Age of Onset  . Coronary artery disease Mother   . Diabetes Mother   . Irritable bowel syndrome Mother   . Colon cancer Neg Hx   . Esophageal cancer Neg Hx   . Rectal cancer Neg Hx   . Stomach cancer Neg Hx      Current Outpatient Medications:  .  ALPRAZolam (XANAX) 0.5 MG tablet, Take 0.25 mg by mouth at bedtime as needed., Disp: , Rfl:  .  aspirin 81 MG EC tablet, Take 81 mg by mouth daily., Disp: , Rfl:  .   Clobetasol Propionate Emulsion 0.05 % topical foam, Apply 1 application topically 2 (two) times daily., Disp: 100 g, Rfl: 3 .  escitalopram (LEXAPRO) 20 MG tablet, Take 1 tablet (20 mg total) by mouth daily., Disp: 90 tablet, Rfl: 3 .  esomeprazole (NEXIUM) 20 MG capsule, Take 1 capsule (20 mg total) by mouth daily at 12 noon., Disp: 90 capsule, Rfl: 3 .  estradiol (ESTRACE) 0.1 MG/GM vaginal cream, SMARTSIG:Gram(s) Vaginal Daily, Disp: , Rfl:  .  fish oil-omega-3 fatty acids 1000 MG capsule, Take 1 g by mouth daily., Disp: , Rfl:  .  gabapentin (NEURONTIN) 300 MG capsule, Take 1 capsule (300 mg total) by mouth at bedtime., Disp: 90 capsule, Rfl: 3 .  hydrochlorothiazide (MICROZIDE) 12.5 MG capsule, Take 1 capsule (12.5 mg total) by mouth daily., Disp: 90 capsule, Rfl: 3 .  Linaclotide (LINZESS) 145 MCG CAPS, Take 145 mcg by mouth daily as needed., Disp: , Rfl:  .  meclizine (ANTIVERT) 25 MG tablet, Take 1 tablet (25 mg total) by mouth every 4 (four) hours as needed for dizziness., Disp: 120 tablet, Rfl: 3 .  olmesartan-hydrochlorothiazide (BENICAR HCT) 20-12.5 MG tablet, Take 1 tablet by mouth daily., Disp: 90 tablet, Rfl: 3 .  valACYclovir (VALTREX) 500 MG  tablet, Take 1,000 mg by mouth 2 (two) times daily., Disp: , Rfl:  .  vitamin B-12 (CYANOCOBALAMIN) 250 MCG tablet, Take 250 mcg by mouth daily., Disp: , Rfl:  .  vitamin E 600 UNIT capsule, Take 600 Units by mouth daily., Disp: , Rfl:  .  Zinc 50 MG TABS, Take by mouth., Disp: , Rfl:  .  OVER THE COUNTER MEDICATION, Hair, nail and skin vitamin, One tablet three times daily., Disp: , Rfl:   EXAM:  VITALS per patient if applicable:  GENERAL: alert, oriented, appears well and in no acute distress  HEENT: atraumatic, conjunttiva clear, no obvious abnormalities on inspection of external nose and ears  NECK: normal movements of the head and neck  LUNGS: on inspection no signs of respiratory distress, breathing rate appears normal, no  obvious gross SOB, gasping or wheezing  CV: no obvious cyanosis  MS: moves all visible extremities without noticeable abnormality  PSYCH/NEURO: pleasant and cooperative, no obvious depression or anxiety, speech and thought processing grossly intact  ASSESSMENT AND PLAN: Viral illness. She will continue to treat this symptomatically as above. I encouraged her to get tested today for the Covid-19 virus and to quarantine at home.  Alysia Penna, MD  Discussed the following assessment and plan:  No diagnosis found.     I discussed the assessment and treatment plan with the patient. The patient was provided an opportunity to ask questions and all were answered. The patient agreed with the plan and demonstrated an understanding of the instructions.   The patient was advised to call back or seek an in-person evaluation if the symptoms worsen or if the condition fails to improve as anticipated.     Review of Systems     Objective:   Physical Exam        Assessment & Plan:

## 2020-11-25 ENCOUNTER — Encounter: Payer: Self-pay | Admitting: Family Medicine

## 2020-11-25 NOTE — Telephone Encounter (Signed)
Set up a virtual OV to discuss this

## 2020-12-04 ENCOUNTER — Other Ambulatory Visit: Payer: Self-pay

## 2020-12-04 ENCOUNTER — Emergency Department (INDEPENDENT_AMBULATORY_CARE_PROVIDER_SITE_OTHER)
Admission: RE | Admit: 2020-12-04 | Discharge: 2020-12-04 | Disposition: A | Discharge status: Home / Self Care | Payer: Medicare Other | Source: Ambulatory Visit

## 2020-12-04 VITALS — BP 114/76 | HR 89 | Temp 97.9°F | Resp 15

## 2020-12-04 DIAGNOSIS — H6591 Unspecified nonsuppurative otitis media, right ear: Secondary | ICD-10-CM

## 2020-12-04 MED ORDER — FLUTICASONE PROPIONATE 50 MCG/ACT NA SUSP
1.0000 | Freq: Every day | NASAL | 2 refills | Status: DC
Start: 2020-12-04 — End: 2023-03-06

## 2020-12-04 MED ORDER — AMOXICILLIN 500 MG PO CAPS
500.0000 mg | ORAL_CAPSULE | Freq: Three times a day (TID) | ORAL | 0 refills | Status: DC
Start: 2020-12-04 — End: 2021-01-18

## 2020-12-04 NOTE — ED Provider Notes (Signed)
[facility]    CSN: [id number] Arrival date & time: 12/04/20  0941      History   Chief Complaint Chief Complaint  Patient presents with  . Appointment    10:00  . Otalgia    Right    HPI CAROLA VIRAMONTES is a 70 y.o. female.   Complains of pain and fullness in her right ear.  There is some associated dizziness with vomiting.  Tested positive for Covid about 3 weeks ago.  HPI  Past Medical History:  Diagnosis Date  . Anemia   . Anxiety   . B12 deficiency   . Depression   . Esophageal reflux   . Esophageal stricture   . Fatty liver   . Hypertension   . IBS (irritable bowel syndrome)   . Insomnia   . Neck pain   . Routine gynecological examination    sees Dr. Alden Hipp     Patient Active Problem List   Diagnosis Date Noted  . Nausea and vomiting 12/08/2011  . Gastroparesis 12/08/2011  . Constipation, slow transit 12/08/2011  . Weight loss, abnormal 12/08/2011  . TINNITUS 08/19/2009  . VERTIGO 08/19/2009  . Iron deficiency anemia 05/19/2009  . ESOPHAGEAL STRICTURE 05/19/2009  . IRRITABLE BOWEL SYNDROME 05/19/2009  . FATTY LIVER DISEASE 05/19/2009  . CONSTIPATION 03/19/2009  . PARESTHESIA 03/19/2009  . GERD 12/05/2008  . Depression with anxiety 03/05/2008  . BACK PAIN, THORACIC REGION 12/03/2007  . Anxiety state 06/28/2007  . Essential hypertension 06/28/2007    Past Surgical History:  Procedure Laterality Date  . APPENDECTOMY    . COLONOSCOPY  06/28/2019   per Dr. Havery Moros, benign polyps, repeat in 10 yrs   . ESOPHAGOGASTRODUODENOSCOPY  06/28/2019   per Dr. Havery Moros, benign gastric polyp, source of GI blood loss   . TONSILLECTOMY    . TUBAL LIGATION      OB History   No obstetric history on file.      Home Medications    Prior to Admission medications   Medication Sig Start Date End Date Taking? Authorizing Provider  ALPRAZolam Duanne Moron) 0.5 MG tablet Take 0.25 mg by mouth at bedtime as needed.    [provider]  aspirin 81 MG EC tablet Take 81 mg by mouth daily.    [provider]  Clobetasol Propionate Emulsion 0.05 % topical foam Apply 1 application topically 2 (two) times daily. 01/22/20   Laurey Morale, MD  escitalopram (LEXAPRO) 20 MG tablet Take 1 tablet (20 mg total) by mouth daily. 01/22/20   Laurey Morale, MD  esomeprazole (NEXIUM) 20 MG capsule Take 1 capsule (20 mg total) by mouth daily at 12 noon. 10/04/19   Yetta Flock, MD  estradiol (ESTRACE) 0.1 MG/GM vaginal cream SMARTSIG:Gram(s) Vaginal Daily 02/21/20   [provider]  fish oil-omega-3 fatty acids 1000 MG capsule Take 1 g by mouth daily.    [provider]  gabapentin (NEURONTIN) 300 MG capsule Take 1 capsule (300 mg total) by mouth at bedtime. 06/05/20   Laurey Morale, MD  hydrochlorothiazide (MICROZIDE) 12.5 MG capsule Take 1 capsule (12.5 mg total) by mouth daily. 01/22/20   Laurey Morale, MD  Linaclotide Rolan Lipa) 145 MCG CAPS Take 145 mcg by mouth daily as needed. 11/29/12   Sable Feil, MD  meclizine (ANTIVERT) 25 MG tablet Take 1 tablet (25 mg total) by mouth every 4 (four) hours as needed for dizziness. 07/24/20   Laurey Morale, MD  olmesartan-hydrochlorothiazide (BENICAR HCT) 20-12.5 MG tablet Take 1 tablet by mouth daily. 01/22/20   Laurey Morale, MD  OVER THE COUNTER MEDICATION Hair, nail and skin vitamin, One tablet three times daily.    [provider]  valACYclovir (VALTREX) 500 MG tablet Take 1,000 mg by mouth 2 (two) times daily.    [provider]  vitamin B-12 (CYANOCOBALAMIN) 250 MCG tablet Take 250 mcg by mouth daily.    [provider]  vitamin E 600 UNIT capsule Take 600 Units by mouth daily.    [provider]  Zinc 50 MG TABS Take by mouth.    [provider]    Family History Family History  Problem Relation Age of Onset  . Coronary artery disease Mother   . Diabetes Mother   . Irritable bowel syndrome Mother   . Heart  failure Mother   . Healthy Father   . Colon cancer Neg Hx   . Esophageal cancer Neg Hx   . Rectal cancer Neg Hx   . Stomach cancer Neg Hx     Social History Social History   Tobacco Use  . Smoking status: Never Smoker  . Smokeless tobacco: Never Used  Substance Use Topics  . Alcohol use: Yes    Alcohol/week: 2.0 standard drinks    Types: 2 Standard drinks or equivalent per week  . Drug use: No     Allergies   Penicillins   Review of Systems Review of Systems  HENT: Positive for congestion and ear pain.   All other systems reviewed and are negative.    Physical Exam Triage Vital Signs ED Triage Vitals  Enc Vitals Group     BP 12/04/20 1007 114/76     Pulse Rate 12/04/20 1007 89     Resp 12/04/20 1007 15     Temp 12/04/20 1007 97.9 F (36.6 C)     Temp Source 12/04/20 1007 Oral     SpO2 12/04/20 1007 99 %     Weight --      Height --      Head Circumference --      Peak Flow --      Pain Score 12/04/20 1004 0     Pain Loc --      Pain Edu? --      Excl. in [place]? --    No data found.  Updated Vital Signs BP 114/76 (BP Location: Right Arm)   Pulse 89   Temp 97.9 F (36.6 C) (Oral)   Resp 15   SpO2 99%   Visual Acuity Right Eye Distance:   Left Eye Distance:   Bilateral Distance:    Right Eye Near:   Left Eye Near:    Bilateral Near:     Physical Exam Vitals and nursing note reviewed.  Constitutional:      Appearance: Normal appearance.  HENT:     Head: Normocephalic.     Ears:     Comments: TMs are dull and do not move to air insufflation or Valsalva maneuver Cardiovascular:     Rate and Rhythm: Normal rate.  Neurological:     Mental Status: She is alert.      UC Treatments / Results  Labs (all labs ordered are listed, but only abnormal results are displayed) Labs Reviewed - No data to display  EKG   Radiology No results found.  Procedures Procedures (including critical care time)  Medications Ordered in UC Medications -  No data to  display  Initial Impression / Assessment and Plan / UC Course  I have reviewed the triage vital signs and the nursing notes.  Pertinent labs & imaging results that were available during my care of the patient were reviewed by me and considered in my medical decision making (see chart for details).     Serous effusion right side Final Clinical Impressions(s) / UC Diagnoses   Final diagnoses:  None   Discharge Instructions   None    ED Prescriptions    None     PDMP not reviewed this encounter.   Wardell Honour, MD 12/04/20 1102

## 2020-12-04 NOTE — ED Triage Notes (Signed)
Patient presents to Urgent Care with complaints of right ear pain and fullness since last week. Patient reports the ear has been causing dizziness and she had an episode of vomiting last week, took a meclizine for the dizziness and it seemed to help but she only takes it when she has symptoms.

## 2021-01-04 DIAGNOSIS — N3020 Other chronic cystitis without hematuria: Secondary | ICD-10-CM | POA: Diagnosis not present

## 2021-01-18 ENCOUNTER — Ambulatory Visit (INDEPENDENT_AMBULATORY_CARE_PROVIDER_SITE_OTHER): Payer: Medicare Other | Admitting: Internal Medicine

## 2021-01-18 ENCOUNTER — Other Ambulatory Visit: Payer: Self-pay

## 2021-01-18 ENCOUNTER — Encounter: Payer: Self-pay | Admitting: Internal Medicine

## 2021-01-18 VITALS — BP 133/73 | HR 70 | Temp 97.9°F | Ht 69.0 in | Wt 180.0 lb

## 2021-01-18 DIAGNOSIS — B9629 Other Escherichia coli [E. coli] as the cause of diseases classified elsewhere: Secondary | ICD-10-CM

## 2021-01-18 DIAGNOSIS — Z1612 Extended spectrum beta lactamase (ESBL) resistance: Secondary | ICD-10-CM

## 2021-01-18 DIAGNOSIS — N390 Urinary tract infection, site not specified: Secondary | ICD-10-CM

## 2021-01-18 NOTE — Assessment & Plan Note (Signed)
Discussed with patient that her presentation was more c/w asymptomatic bacteruria and this did not require antibiotics.  Her recent ESBL E coli isolate was not sensitive to Macrobid but her symptoms have resolved nonetheless and I would not recommend any further antibiotics especially since she already has evidence of a drug resistant bacteria.  If she has further UTI symptoms in the future she could empirically be given fosfomycin based on the presence of ESBL E coli in an effort to avoid a PICC line and course of IV therapy.

## 2021-01-18 NOTE — Patient Instructions (Signed)
Thank you for coming to see me today. It was a pleasure seeing you.  To Do: Marland Kitchen No further antibiotics are needed right now . If you do develop symptoms of UTI again reach out to your urology office.  They may want to consider a dose of Fosfomycin given your prior cultures . Follow up with me as needed.   If you have any questions or concerns, please do not hesitate to call the office at [phone].  Take Care,   Jule Ser, DO

## 2021-01-18 NOTE — Progress Notes (Signed)
[facility] Infectious Disease  Reason for Consult: Recurrent urinary tract infection  Referring Provider: Dr. Ellison Hughs (urology)   HPI:    Pamela Daugherty is a 70 y.o. female with PMHx as below who presents to the clinic for recurrent urinary tract infections and ESBL.   [facility] follows [facility] for recurrent UTIs and postmenopausal urethral/vaginal atrophy.  She was most recently seen on 01/04/2021 for routine follow-up.  She had a CT scan in August 2021 that showed bladder changes consistent with chronic cystitis with no other GU abnormalities.  Underwent cystoscopy which revealed diffuse cystitis cystica. At her most recent visit 2 weeks ago she had had a recent GI illness with diarrhea but she otherwise denied any fevers, chills, hematuria, increased frequency, dysuria.  Urinalysis obtained at that time however 3/14 had cloudy yellow urine, positive nitrites, pyuria, and many bacteria.  She was given a prescription for Macrobid twice per day for 7 days and cultures grew >100k colonies ESBL E coli.  However, this particular isolate was found to be resistant to Macrobid and she was referred to ID.  Shortly after this visit she had some burning with urination but this has resolved and today has no symptoms.  Of note, in August 2021 she was treated for ESBL E. coli positive urine culture with Augmentin with resolution of her dysuria and resolution of her UA.  Patient's Medications  New Prescriptions   No medications on file  Previous Medications   ALPRAZOLAM (XANAX) 0.5 MG TABLET    Take 0.25 mg by mouth at bedtime as needed.   ASPIRIN 81 MG EC TABLET    Take 81 mg by mouth daily.   CLOBETASOL PROPIONATE EMULSION 0.05 % TOPICAL FOAM    Apply 1 application topically 2 (two) times daily.   CRANBERRY 250 MG TABS    Take 250 mg by mouth in the morning and at bedtime.   D-MANNOSE 500 MG CAPS    Take 500 mg by mouth daily.   ESCITALOPRAM (LEXAPRO) 20 MG TABLET     Take 1 tablet (20 mg total) by mouth daily.   ESOMEPRAZOLE (NEXIUM) 20 MG CAPSULE    Take 1 capsule (20 mg total) by mouth daily at 12 noon.   ESTRADIOL (ESTRACE) 0.1 MG/GM VAGINAL CREAM    SMARTSIG:Gram(s) Vaginal Daily   FISH OIL-OMEGA-3 FATTY ACIDS 1000 MG CAPSULE    Take 1 g by mouth daily.   FLUTICASONE (FLONASE) 50 MCG/ACT NASAL SPRAY    Place 1 spray into both nostrils daily.   GABAPENTIN (NEURONTIN) 300 MG CAPSULE    Take 1 capsule (300 mg total) by mouth at bedtime.   GARLIC 8563 MG CAPS    Take 1,000 mg by mouth daily.   HYDROCHLOROTHIAZIDE (MICROZIDE) 12.5 MG CAPSULE    Take 1 capsule (12.5 mg total) by mouth daily.   KETOCONAZOLE (NIZORAL) 2 % CREAM    Apply BID to affected areas on face  for 3 weeks then PRN   LINACLOTIDE (LINZESS) 145 MCG CAPS    Take 145 mcg by mouth daily as needed.   MECLIZINE (ANTIVERT) 25 MG TABLET    Take 1 tablet (25 mg total) by mouth every 4 (four) hours as needed for dizziness.   OLMESARTAN-HYDROCHLOROTHIAZIDE (BENICAR HCT) 20-12.5 MG TABLET    Take 1 tablet by mouth daily.   PROBIOTIC PRODUCT (PROBIOTIC PO)    Take 1 capsule by mouth daily.   VALACYCLOVIR (VALTREX) 500 MG TABLET  Take 1,000 mg by mouth 2 (two) times daily.   VITAMIN B-12 (CYANOCOBALAMIN) 250 MCG TABLET    Take 250 mcg by mouth daily.   VITAMIN C (ASCORBIC ACID) 500 MG TABLET    Take 500 mg by mouth daily.   VITAMIN E 600 UNIT CAPSULE    Take 600 Units by mouth daily.   ZINC 50 MG TABS    Take by mouth.  Modified Medications   No medications on file  Discontinued Medications   AMOXICILLIN (AMOXIL) 500 MG CAPSULE    Take 1 capsule (500 mg total) by mouth 3 (three) times daily.   NITROFURANTOIN, MACROCRYSTAL-MONOHYDRATE, (MACROBID) 100 MG CAPSULE    Take 100 mg by mouth 2 (two) times daily.   OVER THE COUNTER MEDICATION    Hair, nail and skin vitamin, One tablet three times daily.      Past Medical History:  Diagnosis Date  . Anemia   . Anxiety   . B12 deficiency   .  Depression   . Esophageal reflux   . Esophageal stricture   . Fatty liver   . Hypertension   . IBS (irritable bowel syndrome)   . Insomnia   . Neck pain   . Routine gynecological examination    sees Dr. Alden Hipp     Social History   Tobacco Use  . Smoking status: Never Smoker  . Smokeless tobacco: Never Used  Substance Use Topics  . Alcohol use: Yes    Alcohol/week: 2.0 standard drinks    Types: 2 Standard drinks or equivalent per week  . Drug use: No    Family History  Problem Relation Age of Onset  . Coronary artery disease Mother   . Diabetes Mother   . Irritable bowel syndrome Mother   . Heart failure Mother   . Healthy Father   . Colon cancer Neg Hx   . Esophageal cancer Neg Hx   . Rectal cancer Neg Hx   . Stomach cancer Neg Hx     Allergies  Allergen Reactions  . Penicillins     rash    Review of Systems  Constitutional: Negative for chills and fever.  Respiratory: Negative.   Cardiovascular: Negative.   Gastrointestinal: Negative for abdominal pain and diarrhea.  Genitourinary: Negative for dysuria, flank pain, frequency and urgency.      OBJECTIVE:    Vitals:   01/18/21 0946  BP: 133/73  Pulse: 70  Temp: 97.9 F (36.6 C)  TempSrc: Oral  SpO2: 99%  Weight: 180 lb (81.6 kg)  Height: 5\' 9"  (1.753 m)     Body mass index is 26.58 kg/m.  Physical Exam Constitutional:      General: She is not in acute distress.    Appearance: Normal appearance.  HENT:     Head: Normocephalic and atraumatic.  Eyes:     Extraocular Movements: Extraocular movements intact.     Conjunctiva/sclera: Conjunctivae normal.  Pulmonary:     Effort: Pulmonary effort is normal. No respiratory distress.  Abdominal:     General: Bowel sounds are normal.     Palpations: Abdomen is soft.     Tenderness: There is no abdominal tenderness. There is no guarding or rebound.  Musculoskeletal:     Cervical back: Normal range of motion and neck supple.  Skin:     General: Skin is warm and dry.     Findings: No rash.  Neurological:     General: No focal deficit present.  Mental Status: She is alert and oriented to person, place, and time.  Psychiatric:        Mood and Affect: Mood normal.        Behavior: Behavior normal.      Labs and Microbiology:  CBC Latest Ref Rng & Units 06/05/2020 01/01/2020 08/26/2019  WBC 3.8 - 10.8 Thousand/uL 6.2 7.1 8.0  Hemoglobin 11.7 - 15.5 g/dL 13.8 12.8 13.1  Hematocrit 35.0 - 45.0 % 41.9 37.2 39.7  Platelets 140 - 400 Thousand/uL 209 223.0 241.0   CMP Latest Ref Rng & Units 06/05/2020 05/28/2019 12/26/2017  Glucose 65 - 99 mg/dL 92 86 95  BUN 7 - 25 mg/dL 14 12 13   Creatinine 0.50 - 0.99 mg/dL 0.70 0.71 0.74  Sodium 135 - 146 mmol/L 140 141 138  Potassium 3.5 - 5.3 mmol/L 4.4 4.2 4.2  Chloride 98 - 110 mmol/L 102 105 103  CO2 20 - 32 mmol/L 30 27 28   Calcium 8.6 - 10.4 mg/dL 9.6 9.4 9.7  Total Protein 6.1 - 8.1 g/dL 6.5 6.5 6.7  Total Bilirubin 0.2 - 1.2 mg/dL 0.4 0.4 0.4  Alkaline Phos 39 - 117 U/L - 76 71  AST 10 - 35 U/L 18 26 15   ALT 6 - 29 U/L 11 10 7             ASSESSMENT & PLAN:    UTI due to extended-spectrum beta lactamase (ESBL) producing Escherichia coli Discussed with patient that her presentation was more c/w asymptomatic bacteruria and this did not require antibiotics.  Her recent ESBL E coli isolate was not sensitive to Macrobid but her symptoms have resolved nonetheless and I would not recommend any further antibiotics especially since she already has evidence of a drug resistant bacteria.  If she has further UTI symptoms in the future she could empirically be given fosfomycin based on the presence of ESBL E coli in an effort to avoid a PICC line and course of IV therapy.   [facility] for Infectious [facility] 01/18/2021, 10:18 AM  I spent 45 minutes dedicated to the care of this patient on the date of this encounter to include  pre-visit review of records, face-to-face time with the patient discussing ESBL UTI, and post-visit ordering of testing.

## 2021-01-19 ENCOUNTER — Encounter: Payer: Self-pay | Admitting: Family Medicine

## 2021-01-20 NOTE — Telephone Encounter (Signed)
No this does not cause cancer. Please keep taking it

## 2021-01-27 DIAGNOSIS — H16403 Unspecified corneal neovascularization, bilateral: Secondary | ICD-10-CM | POA: Diagnosis not present

## 2021-01-27 DIAGNOSIS — H524 Presbyopia: Secondary | ICD-10-CM | POA: Diagnosis not present

## 2021-01-27 DIAGNOSIS — H5213 Myopia, bilateral: Secondary | ICD-10-CM | POA: Diagnosis not present

## 2021-01-27 DIAGNOSIS — H2513 Age-related nuclear cataract, bilateral: Secondary | ICD-10-CM | POA: Diagnosis not present

## 2021-02-11 ENCOUNTER — Encounter: Payer: Self-pay | Admitting: Family Medicine

## 2021-02-11 ENCOUNTER — Other Ambulatory Visit: Payer: Self-pay

## 2021-02-11 MED ORDER — ESCITALOPRAM OXALATE 20 MG PO TABS: 20.0000 mg | ORAL_TABLET | Freq: Every day | ORAL | 2 refills | Status: DC

## 2021-02-11 MED ORDER — OLMESARTAN MEDOXOMIL-HCTZ 20-12.5 MG PO TABS: 1.0000 | ORAL_TABLET | Freq: Every day | ORAL | 2 refills | Status: DC

## 2021-02-12 MED ORDER — GABAPENTIN 300 MG PO CAPS: 300.0000 mg | ORAL_CAPSULE | Freq: Every day | ORAL | 0 refills | Status: DC

## 2021-03-01 ENCOUNTER — Other Ambulatory Visit: Payer: Self-pay | Admitting: Family Medicine

## 2021-03-31 DIAGNOSIS — H8101 Meniere's disease, right ear: Secondary | ICD-10-CM | POA: Diagnosis not present

## 2021-03-31 DIAGNOSIS — H903 Sensorineural hearing loss, bilateral: Secondary | ICD-10-CM | POA: Diagnosis not present

## 2021-03-31 DIAGNOSIS — R42 Dizziness and giddiness: Secondary | ICD-10-CM | POA: Diagnosis not present

## 2021-03-31 DIAGNOSIS — H838X3 Other specified diseases of inner ear, bilateral: Secondary | ICD-10-CM | POA: Diagnosis not present

## 2021-04-08 DIAGNOSIS — N3020 Other chronic cystitis without hematuria: Secondary | ICD-10-CM | POA: Diagnosis not present

## 2021-06-02 ENCOUNTER — Encounter: Payer: Medicare Other | Admitting: Family Medicine

## 2021-06-21 ENCOUNTER — Other Ambulatory Visit: Payer: Self-pay

## 2021-06-22 ENCOUNTER — Encounter: Payer: Self-pay | Admitting: Family Medicine

## 2021-06-22 ENCOUNTER — Ambulatory Visit (INDEPENDENT_AMBULATORY_CARE_PROVIDER_SITE_OTHER): Payer: Medicare Other | Admitting: Family Medicine

## 2021-06-22 VITALS — BP 118/80 | HR 70 | Temp 98.3°F | Ht 67.5 in | Wt 178.0 lb

## 2021-06-22 DIAGNOSIS — F418 Other specified anxiety disorders: Secondary | ICD-10-CM

## 2021-06-22 DIAGNOSIS — I10 Essential (primary) hypertension: Secondary | ICD-10-CM | POA: Diagnosis not present

## 2021-06-22 DIAGNOSIS — K219 Gastro-esophageal reflux disease without esophagitis: Secondary | ICD-10-CM

## 2021-06-22 DIAGNOSIS — K581 Irritable bowel syndrome with constipation: Secondary | ICD-10-CM | POA: Diagnosis not present

## 2021-06-22 DIAGNOSIS — D509 Iron deficiency anemia, unspecified: Secondary | ICD-10-CM | POA: Diagnosis not present

## 2021-06-22 DIAGNOSIS — R739 Hyperglycemia, unspecified: Secondary | ICD-10-CM | POA: Diagnosis not present

## 2021-06-22 DIAGNOSIS — Z23 Encounter for immunization: Secondary | ICD-10-CM | POA: Diagnosis not present

## 2021-06-22 LAB — LIPID PANEL
Cholesterol: 177 mg/dL (ref 0–200)
HDL: 48.8 mg/dL (ref >39.00)
LDL Cholesterol: 104 mg/dL — ABNORMAL HIGH (ref 0–99)
NonHDL: 127.93
Total CHOL/HDL Ratio: 4
Triglycerides: 118 mg/dL (ref 0.0–149.0)
VLDL: 23.6 mg/dL (ref 0.0–40.0)

## 2021-06-22 LAB — HEMOGLOBIN A1C: Hgb A1c MFr Bld: 5.6 % (ref 4.6–6.5)

## 2021-06-22 LAB — BASIC METABOLIC PANEL
BUN: 11 mg/dL (ref 6–23)
CO2: 28 mEq/L (ref 19–32)
Calcium: 9.6 mg/dL (ref 8.4–10.5)
Chloride: 100 mEq/L (ref 96–112)
Creatinine, Ser: 0.71 mg/dL (ref 0.40–1.20)
GFR: 86.5 mL/min (ref >60.00)
Glucose, Bld: 84 mg/dL (ref 70–99)
Potassium: 4 mEq/L (ref 3.5–5.1)
Sodium: 136 mEq/L (ref 135–145)

## 2021-06-22 LAB — HEPATIC FUNCTION PANEL
ALT: 10 U/L (ref 0–35)
AST: 19 U/L (ref 0–37)
Albumin: 4.3 g/dL (ref 3.5–5.2)
Alkaline Phosphatase: 71 U/L (ref 39–117)
Bilirubin, Direct: 0.1 mg/dL (ref 0.0–0.3)
Total Bilirubin: 0.5 mg/dL (ref 0.2–1.2)
Total Protein: 6.7 g/dL (ref 6.0–8.3)

## 2021-06-22 LAB — CBC WITH DIFFERENTIAL/PLATELET
Basophils Absolute: 0 10*3/uL (ref 0.0–0.1)
Basophils Relative: 0.7 % (ref 0.0–3.0)
Eosinophils Absolute: 0.2 10*3/uL (ref 0.0–0.7)
Eosinophils Relative: 3 % (ref 0.0–5.0)
HCT: 39.5 % (ref 36.0–46.0)
Hemoglobin: 13.4 g/dL (ref 12.0–15.0)
Lymphocytes Relative: 35.1 % (ref 12.0–46.0)
Lymphs Abs: 2 10*3/uL (ref 0.7–4.0)
MCHC: 33.9 g/dL (ref 30.0–36.0)
MCV: 84.4 fl (ref 78.0–100.0)
Monocytes Absolute: 0.4 10*3/uL (ref 0.1–1.0)
Monocytes Relative: 6.9 % (ref 3.0–12.0)
Neutro Abs: 3.1 10*3/uL (ref 1.4–7.7)
Neutrophils Relative %: 54.3 % (ref 43.0–77.0)
Platelets: 236 10*3/uL (ref 150.0–400.0)
RBC: 4.68 Mil/uL (ref 3.87–5.11)
RDW: 13.8 % (ref 11.5–15.5)
WBC: 5.8 10*3/uL (ref 4.0–10.5)

## 2021-06-22 LAB — TSH: TSH: 2.97 u[IU]/mL (ref 0.35–5.50)

## 2021-06-22 MED ORDER — GABAPENTIN 300 MG PO CAPS: 300.0000 mg | ORAL_CAPSULE | Freq: Every day | ORAL | 3 refills | Status: DC

## 2021-06-22 MED ORDER — ESOMEPRAZOLE MAGNESIUM 20 MG PO CPDR
20.0000 mg | DELAYED_RELEASE_CAPSULE | Freq: Every day | ORAL | 3 refills | Status: DC
Start: 2021-06-22 — End: 2023-07-10

## 2021-06-22 MED ORDER — ALPRAZOLAM 0.5 MG PO TABS: 0.5000 mg | ORAL_TABLET | Freq: Every evening | ORAL | 1 refills | Status: DC | PRN

## 2021-06-22 NOTE — Addendum Note (Signed)
Addended by: Amanda Cockayne on: 06/22/2021 08:55 AM   Modules accepted: Orders

## 2021-06-22 NOTE — Progress Notes (Signed)
Subjective:    Patient ID: Pamela Daugherty, female    DOB: 1951-08-03, 70 y.o.   MRN: [id number]  HPI Here to follow up on issues. She feels well in general. Her constipation is well controlled by taking a probiotic. She no longer uses Linzess. Her GERD is stable. Her depression and anxiety are stable. Her BP is stable. She recently took a trip to the beach with friends, and she had a great time.    Review of Systems  Constitutional: Negative.   HENT: Negative.    Eyes: Negative.   Respiratory: Negative.    Cardiovascular: Negative.   Gastrointestinal: Negative.   Genitourinary:  Negative for decreased urine volume, difficulty urinating, dyspareunia, dysuria, enuresis, flank pain, frequency, hematuria, pelvic pain and urgency.  Musculoskeletal: Negative.   Skin: Negative.   Neurological: Negative.  Negative for headaches.  Psychiatric/Behavioral: Negative.        Objective:   Physical Exam Constitutional:      General: She is not in acute distress.    Appearance: Normal appearance. She is well-developed.  HENT:     Head: Normocephalic and atraumatic.     Right Ear: External ear normal.     Left Ear: External ear normal.     Nose: Nose normal.     Mouth/Throat:     Pharynx: No oropharyngeal exudate.  Eyes:     General: No scleral icterus.    Conjunctiva/sclera: Conjunctivae normal.     Pupils: Pupils are equal, round, and reactive to light.  Neck:     Thyroid: No thyromegaly.     Vascular: No JVD.  Cardiovascular:     Rate and Rhythm: Normal rate and regular rhythm.     Heart sounds: Normal heart sounds. No murmur heard.   No friction rub. No gallop.  Pulmonary:     Effort: Pulmonary effort is normal. No respiratory distress.     Breath sounds: Normal breath sounds. No wheezing or rales.  Chest:     Chest wall: No tenderness.  Abdominal:     General: Bowel sounds are normal. There is no distension.     Palpations: Abdomen is soft. There is no mass.     Tenderness:  There is no abdominal tenderness. There is no guarding or rebound.  Musculoskeletal:        General: No tenderness. Normal range of motion.     Cervical back: Normal range of motion and neck supple.  Lymphadenopathy:     Cervical: No cervical adenopathy.  Skin:    General: Skin is warm and dry.     Findings: No erythema or rash.  Neurological:     Mental Status: She is alert and oriented to person, place, and time.     Cranial Nerves: No cranial nerve deficit.     Motor: No abnormal muscle tone.     Coordination: Coordination normal.     Deep Tendon Reflexes: Reflexes are normal and symmetric. Reflexes normal.  Psychiatric:        Behavior: Behavior normal.        Thought Content: Thought content normal.        Judgment: Judgment normal.          Assessment & Plan:  Her HTN and constipation and GERD are stable. Her depression and anxiety are well controlled. She is given a pneumococcal 23 vaccine today. We will send her for fasting labs to check for anemia, etc. We spent 35 minutes reviewing records and discussing these issues.  Alysia Penna, MD

## 2021-06-22 NOTE — Addendum Note (Signed)
Addended by: Wyvonne Lenz on: 06/22/2021 08:56 AM   Modules accepted: Orders

## 2021-07-12 ENCOUNTER — Encounter: Payer: Self-pay | Admitting: Family Medicine

## 2021-07-13 NOTE — Telephone Encounter (Signed)
No, I do not think her dizziness is coning from her cholesterol or her BP medication. This is from her Meniere's disease (which affects the inner ears). She should follow up with Dr. Emelia Loron (ENT) for this

## 2021-07-16 ENCOUNTER — Ambulatory Visit (INDEPENDENT_AMBULATORY_CARE_PROVIDER_SITE_OTHER): Payer: Medicare Other

## 2021-07-16 DIAGNOSIS — Z0000 Encounter for general adult medical examination without abnormal findings: Secondary | ICD-10-CM

## 2021-07-16 DIAGNOSIS — Z780 Asymptomatic menopausal state: Secondary | ICD-10-CM

## 2021-07-16 NOTE — Patient Instructions (Signed)
Pamela Daugherty , Thank you for taking time to come for your Medicare Wellness Visit. I appreciate your ongoing commitment to your health goals. Please review the following plan we discussed and let me know if I can assist you in the future.   Screening recommendations/referrals: Colonoscopy: 06/28/2019  due 2030 Mammogram: 09/09/2020 Bone Density: referral completed 07/16/2021 Recommended yearly ophthalmology/optometry visit for glaucoma screening and checkup Recommended yearly dental visit for hygiene and checkup  Vaccinations: Influenza vaccine: due in fall 2022  Pneumococcal vaccine: completed series  Tdap vaccine: due with injury  Shingles vaccine: due 2nd dose     Advanced directives: none   Conditions/risks identified: none   Next appointment: none    Preventive Care 50 Years and Older, Female Preventive care refers to lifestyle choices and visits with your health care provider that can promote health and wellness. What does preventive care include? A yearly physical exam. This is also called an annual well check. Dental exams once or twice a year. Routine eye exams. Ask your health care provider how often you should have your eyes checked. Personal lifestyle choices, including: Daily care of your teeth and gums. Regular physical activity. Eating a healthy diet. Avoiding tobacco and drug use. Limiting alcohol use. Practicing safe sex. Taking low-dose aspirin every day. Taking vitamin and mineral supplements as recommended by your health care provider. What happens during an annual well check? The services and screenings done by your health care provider during your annual well check will depend on your age, overall health, lifestyle risk factors, and family history of disease. Counseling  Your health care provider may ask you questions about your: Alcohol use. Tobacco use. Drug use. Emotional well-being. Home and relationship well-being. Sexual activity. Eating  habits. History of falls. Memory and ability to understand (cognition). Work and work Statistician. Reproductive health. Screening  You may have the following tests or measurements: Height, weight, and BMI. Blood pressure. Lipid and cholesterol levels. These may be checked every 5 years, or more frequently if you are over 81 years old. Skin check. Lung cancer screening. You may have this screening every year starting at age 25 if you have a 30-pack-year history of smoking and currently smoke or have quit within the past 15 years. Fecal occult blood test (FOBT) of the stool. You may have this test every year starting at age 22. Flexible sigmoidoscopy or colonoscopy. You may have a sigmoidoscopy every 5 years or a colonoscopy every 10 years starting at age 86. Hepatitis C blood test. Hepatitis B blood test. Sexually transmitted disease (STD) testing. Diabetes screening. This is done by checking your blood sugar (glucose) after you have not eaten for a while (fasting). You may have this done every 1-3 years. Bone density scan. This is done to screen for osteoporosis. You may have this done starting at age 54. Mammogram. This may be done every 1-2 years. Talk to your health care provider about how often you should have regular mammograms. Talk with your health care provider about your test results, treatment options, and if necessary, the need for more tests. Vaccines  Your health care provider may recommend certain vaccines, such as: Influenza vaccine. This is recommended every year. Tetanus, diphtheria, and acellular pertussis (Tdap, Td) vaccine. You may need a Td booster every 10 years. Zoster vaccine. You may need this after age 74. Pneumococcal 13-valent conjugate (PCV13) vaccine. One dose is recommended after age 68. Pneumococcal polysaccharide (PPSV23) vaccine. One dose is recommended after age 18. Talk to your  health care provider about which screenings and vaccines you need and how  often you need them. This information is not intended to replace advice given to you by your health care provider. Make sure you discuss any questions you have with your health care provider. Document Released: 11/06/2015 Document Revised: 06/29/2016 Document Reviewed: 08/11/2015 Elsevier Interactive Patient Education  2017 [employer] Prevention in the Home Falls can cause injuries. They can happen to people of all ages. There are many things you can do to make your home safe and to help prevent falls. What can I do on the outside of my home? Regularly fix the edges of walkways and driveways and fix any cracks. Remove anything that might make you trip as you walk through a door, such as a raised step or threshold. Trim any bushes or trees on the path to your home. Use bright outdoor lighting. Clear any walking paths of anything that might make someone trip, such as rocks or tools. Regularly check to see if handrails are loose or broken. Make sure that both sides of any steps have handrails. Any raised decks and porches should have guardrails on the edges. Have any leaves, snow, or ice cleared regularly. Use sand or salt on walking paths during winter. Clean up any spills in your garage right away. This includes oil or grease spills. What can I do in the bathroom? Use night lights. Install grab bars by the toilet and in the tub and shower. Do not use towel bars as grab bars. Use non-skid mats or decals in the tub or shower. If you need to sit down in the shower, use a plastic, non-slip stool. Keep the floor dry. Clean up any water that spills on the floor as soon as it happens. Remove soap buildup in the tub or shower regularly. Attach bath mats securely with double-sided non-slip rug tape. Do not have throw rugs and other things on the floor that can make you trip. What can I do in the bedroom? Use night lights. Make sure that you have a light by your bed that is easy to  reach. Do not use any sheets or blankets that are too big for your bed. They should not hang down onto the floor. Have a firm chair that has side arms. You can use this for support while you get dressed. Do not have throw rugs and other things on the floor that can make you trip. What can I do in the kitchen? Clean up any spills right away. Avoid walking on wet floors. Keep items that you use a lot in easy-to-reach places. If you need to reach something above you, use a strong step stool that has a grab bar. Keep electrical cords out of the way. Do not use floor polish or wax that makes floors slippery. If you must use wax, use non-skid floor wax. Do not have throw rugs and other things on the floor that can make you trip. What can I do with my stairs? Do not leave any items on the stairs. Make sure that there are handrails on both sides of the stairs and use them. Fix handrails that are broken or loose. Make sure that handrails are as long as the stairways. Check any carpeting to make sure that it is firmly attached to the stairs. Fix any carpet that is loose or worn. Avoid having throw rugs at the top or bottom of the stairs. If you do have throw rugs, attach them  to the floor with carpet tape. Make sure that you have a light switch at the top of the stairs and the bottom of the stairs. If you do not have them, ask someone to add them for you. What else can I do to help prevent falls? Wear shoes that: Do not have high heels. Have rubber bottoms. Are comfortable and fit you well. Are closed at the toe. Do not wear sandals. If you use a stepladder: Make sure that it is fully opened. Do not climb a closed stepladder. Make sure that both sides of the stepladder are locked into place. Ask someone to hold it for you, if possible. Clearly mark and make sure that you can see: Any grab bars or handrails. First and last steps. Where the edge of each step is. Use tools that help you move  around (mobility aids) if they are needed. These include: Canes. Walkers. Scooters. Crutches. Turn on the lights when you go into a dark area. Replace any light bulbs as soon as they burn out. Set up your furniture so you have a clear path. Avoid moving your furniture around. If any of your floors are uneven, fix them. If there are any pets around you, be aware of where they are. Review your medicines with your doctor. Some medicines can make you feel dizzy. This can increase your chance of falling. Ask your doctor what other things that you can do to help prevent falls. This information is not intended to replace advice given to you by your health care provider. Make sure you discuss any questions you have with your health care provider. Document Released: 08/06/2009 Document Revised: 03/17/2016 Document Reviewed: 11/14/2014 Elsevier Interactive Patient Education  2017 [employer].

## 2021-07-16 NOTE — Progress Notes (Signed)
Subjective:   Pamela Daugherty is a 70 y.o. female who presents for an Initial Medicare Annual Wellness Visit.   Virtual Visit via Video Note  I connected with Pamela Daugherty by a video enabled telemedicine application and verified that I am speaking with the correct person using two identifiers.  Location: Patient: Home Provider: Office Persons participating in the virtual visit: patient, provider   I discussed the limitations of evaluation and management by telemedicine and the availability of in person appointments. The patient expressed understanding and agreed to proceed.     Mickel Baas Alan Riles,,LPN  Review of Systems    N/A Cardiac Risk Factors include: advanced age (>33men, >38 women);dyslipidemia;hypertension     Objective:    Today's Vitals   There is no height or weight on file to calculate BMI.  Advanced Directives 07/16/2021 07/10/2020  Does Patient Have a Medical Advance Directive? No No  Would patient like information on creating a medical advance directive? No - Patient declined No - Patient declined    Current Medications (verified) Outpatient Encounter Medications as of 07/16/2021  Medication Sig   ALPRAZolam (XANAX) 0.5 MG tablet Take 1 tablet (0.5 mg total) by mouth at bedtime as needed for sleep.   aspirin 81 MG EC tablet Take 81 mg by mouth daily.   Clobetasol Propionate Emulsion 0.05 % topical foam Apply 1 application topically 2 (two) times daily.   Cranberry 250 MG TABS Take 250 mg by mouth in the morning and at bedtime.   D-Mannose 500 MG CAPS Take 500 mg by mouth daily.   escitalopram (LEXAPRO) 20 MG tablet Take 1 tablet (20 mg total) by mouth daily.   esomeprazole (NEXIUM) 20 MG capsule Take 1 capsule (20 mg total) by mouth daily at 12 noon.   estradiol (ESTRACE) 0.1 MG/GM vaginal cream SMARTSIG:Gram(s) Vaginal Daily   fish oil-omega-3 fatty acids 1000 MG capsule Take 1 g by mouth daily.   fluticasone (FLONASE) 50 MCG/ACT nasal spray Place 1 spray into  both nostrils daily.   gabapentin (NEURONTIN) 300 MG capsule Take 1 capsule (300 mg total) by mouth at bedtime.   Garlic 4696 MG CAPS Take 1,000 mg by mouth daily.   meclizine (ANTIVERT) 25 MG tablet Take 1 tablet (25 mg total) by mouth every 4 (four) hours as needed for dizziness.   olmesartan-hydrochlorothiazide (BENICAR HCT) 20-12.5 MG tablet Take 1 tablet by mouth daily.   Probiotic Product (PROBIOTIC PO) Take 1 capsule by mouth daily.   valACYclovir (VALTREX) 500 MG tablet Take 1,000 mg by mouth 2 (two) times daily.   vitamin B-12 (CYANOCOBALAMIN) 250 MCG tablet Take 250 mcg by mouth daily.   vitamin E 600 UNIT capsule Take 600 Units by mouth daily.   ketoconazole (NIZORAL) 2 % cream Apply BID to affected areas on face  for 3 weeks then PRN (Patient not taking: Reported on 07/16/2021)   vitamin C (ASCORBIC ACID) 500 MG tablet Take 500 mg by mouth daily. (Patient not taking: Reported on 07/16/2021)   Zinc 50 MG TABS Take by mouth. (Patient not taking: Reported on 07/16/2021)   No facility-administered encounter medications on file as of 07/16/2021.    Allergies (verified) Penicillins   History: Past Medical History:  Diagnosis Date   Anemia    Anxiety    B12 deficiency    Depression    Esophageal reflux    Esophageal stricture    Fatty liver    Hypertension    IBS (irritable bowel syndrome)  Insomnia    Neck pain    Routine gynecological examination    sees Dr. Alden Hipp    Past Surgical History:  Procedure Laterality Date   APPENDECTOMY     COLONOSCOPY  06/28/2019   per Dr. Havery Moros, benign polyps, repeat in 10 yrs    ESOPHAGOGASTRODUODENOSCOPY  06/28/2019   per Dr. Havery Moros, benign gastric polyp, source of GI blood loss    TONSILLECTOMY     TUBAL LIGATION     Family History  Problem Relation Age of Onset   Coronary artery disease Mother    Diabetes Mother    Irritable bowel syndrome Mother    Heart failure Mother    Healthy Father    Colon cancer Neg  Hx    Esophageal cancer Neg Hx    Rectal cancer Neg Hx    Stomach cancer Neg Hx    Social History   Socioeconomic History   Marital status: Widowed    Spouse name: Not on file   Number of children: Not on file   Years of education: Not on file   Highest education level: Not on file  Occupational History   Occupation: adjuster  Tobacco Use   Smoking status: Never   Smokeless tobacco: Never  Substance and Sexual Activity   Alcohol use: Yes    Alcohol/week: 2.0 standard drinks    Types: 2 Standard drinks or equivalent per week   Drug use: No   Sexual activity: Not Currently  Other Topics Concern   Not on file  Social History Narrative   Not on file   Social Determinants of Health   Financial Resource Strain: Low Risk    Difficulty of Paying Living Expenses: Not hard at all  Food Insecurity: No Food Insecurity   Worried About Charity fundraiser in the Last Year: Never true   [place] in the Last Year: Never true  Transportation Needs: No Transportation Needs   Lack of Transportation (Medical): No   Lack of Transportation (Non-Medical): No  Physical Activity: Insufficiently Active   Days of Exercise per Week: 5 days   Minutes of Exercise per Session: 20 min  Stress: No Stress Concern Present   Feeling of Stress : Not at all  Social Connections: Moderately Isolated   Frequency of Communication with Friends and Family: Three times a week   Frequency of Social Gatherings with Friends and Family: Three times a week   Attends Religious Services: More than 4 times per year   Active Member of Clubs or Organizations: No   Attends Archivist Meetings: Never   Marital Status: Widowed    Tobacco Counseling Counseling given: Not Answered   Clinical Intake:  Pre-visit preparation completed: Yes  Pain : No/denies pain     Nutritional Risks: None Diabetes: No  How often do you need to have someone help you when you read instructions, pamphlets, or  other written materials from your doctor or pharmacy?: 1 - Never What is the last grade level you completed in school?: High School  Diabetic?no  Interpreter Needed?: No  Information entered by :: l.Allannah Kempen,LPN   Activities of Daily Living In your present state of health, do you have any difficulty performing the following activities: 07/16/2021  Hearing? N  Vision? N  Difficulty concentrating or making decisions? N  Walking or climbing stairs? N  Dressing or bathing? N  Doing errands, shopping? N  Preparing Food and eating ? N  Using the Toilet?  N  In the past six months, have you accidently leaked urine? N  Do you have problems with loss of bowel control? N  Managing your Medications? N  Managing your Finances? N  Housekeeping or managing your Housekeeping? N  Some recent data might be hidden    Patient Care Team: Laurey Morale, MD as PCP - General Lovena Neighbours Conception Oms, MD as Consulting Physician (Urology)  Indicate any recent Medical Services you may have received from other than [facility] providers in the past year (date may be approximate).     Assessment:   This is a routine wellness examination for Pamela Daugherty.  Hearing/Vision screen No results found.  Dietary issues and exercise activities discussed: Current Exercise Habits: Home exercise routine, Type of exercise: walking, Time (Minutes): 30, Frequency (Times/Week): 3, Weekly Exercise (Minutes/Week): 90, Intensity: Mild, Exercise limited by: None identified   Goals Addressed   None    Depression Screen PHQ 2/9 Scores 07/16/2021 07/16/2021 06/22/2021 01/18/2021 07/10/2020  PHQ - 2 Score 0 0 0 0 0  PHQ- 9 Score - - 2 - 0    Fall Risk Fall Risk  07/16/2021 06/22/2021 01/18/2021 07/10/2020 12/26/2017  Falls in the past year? 0 0 0 0 No  Number falls in past yr: 0 0 - 0 -  Injury with Fall? 0 0 - 0 -  Risk for fall due to : No Fall Risks No Fall Risks - - -  Follow up Falls evaluation completed - Falls evaluation completed  Falls evaluation completed;Falls prevention discussed -    FALL RISK PREVENTION PERTAINING TO THE HOME:  Any stairs in or around the home? No  If so, are there any without handrails? No  Home free of loose throw rugs in walkways, pet beds, electrical cords, etc? Yes  Adequate lighting in your home to reduce risk of falls? Yes   ASSISTIVE DEVICES UTILIZED TO PREVENT FALLS:  Life alert? No  Use of a cane, walker or w/c? No  Grab bars in the bathroom? No  Shower chair or bench in shower? No  Elevated toilet seat or a handicapped toilet? No    Cognitive Function:    Normal cognitive status assessed by direct observation by this Nurse Health Advisor. No abnormalities found.      Immunizations Immunization History  Administered Date(s) Administered   Fluad Quad(high Dose 65+) 08/31/2019, 08/31/2020   Influenza, High Dose Seasonal PF 11/29/2016, 12/26/2017, 11/06/2018   Influenza,inj,Quad PF,6+ Mos 10/11/2013, 10/13/2014, 10/21/2015   Influenza-Unspecified 11/06/2018   PFIZER(Purple Top)SARS-COV-2 Vaccination 12/07/2019, 12/29/2019   Pneumococcal Conjugate-13 12/26/2017   Pneumococcal Polysaccharide-23 06/22/2021   Zoster Recombinat (Shingrix) 05/14/2018    TDAP status: Due, Education has been provided regarding the importance of this vaccine. Advised may receive this vaccine at local pharmacy or Health Dept. Aware to provide a copy of the vaccination record if obtained from local pharmacy or Health Dept. Verbalized acceptance and understanding.  Flu Vaccine status: Up to date  Pneumococcal vaccine status: Up to date  Covid-19 vaccine status: Completed vaccines  Qualifies for Shingles Vaccine? Yes   Zostavax completed Yes   Shingrix Completed?: Yes  Screening Tests Health Maintenance  Topic Date Due   Hepatitis C Screening  Never done   TETANUS/TDAP  Never done   DEXA SCAN  Never done   Zoster Vaccines- Shingrix (2 of 2) 07/09/2018   INFLUENZA VACCINE  05/24/2021    COVID-19 Vaccine (3 - Booster for [place] series) 06/23/2022 (Originally 05/30/2020)   MAMMOGRAM  09/09/2022  COLONOSCOPY (Pts 45-50yrs Insurance coverage will need to be confirmed)  06/27/2029   HPV VACCINES  Aged Out    Health Maintenance  Health Maintenance Due  Topic Date Due   Hepatitis C Screening  Never done   TETANUS/TDAP  Never done   DEXA SCAN  Never done   Zoster Vaccines- Shingrix (2 of 2) 07/09/2018   INFLUENZA VACCINE  05/24/2021    Colorectal cancer screening: Type of screening: Colonoscopy. Completed 06/28/2019. Repeat every 10 years  Mammogram status: Completed 09/09/2020. Repeat every year  Bone Density status: Ordered 07/16/2021. Pt provided with contact info and advised to call to schedule appt.  Lung Cancer Screening: (Low Dose CT Chest recommended if Age 11-80 years, 30 pack-year currently smoking OR have quit w/in 15years.) does not qualify.   Lung Cancer Screening Referral: n/a  Additional Screening:  Hepatitis C Screening: does qualify;   Vision Screening: Recommended annual ophthalmology exams for early detection of glaucoma and other disorders of the eye. Is the patient up to date with their annual eye exam?  Yes  Who is the provider or what is the name of the office in which the patient attends annual eye exams? [facility]  If pt is not established with a provider, would they like to be referred to a provider to establish care? Yes .   Dental Screening: Recommended annual dental exams for proper oral hygiene  Community Resource Referral / Chronic Care Management: CRR required this visit?  No   CCM required this visit?  No      Plan:     I have personally reviewed and noted the following in the patient's chart:   Medical and social history Use of alcohol, tobacco or illicit drugs  Current medications and supplements including opioid prescriptions. Patient is not currently taking opioid prescriptions. Functional ability and status Nutritional  status Physical activity Advanced directives List of other physicians Hospitalizations, surgeries, and ER visits in previous 12 months Vitals Screenings to include cognitive, depression, and falls Referrals and appointments  In addition, I have reviewed and discussed with patient certain preventive protocols, quality metrics, and best practice recommendations. A written personalized care plan for preventive services as well as general preventive health recommendations were provided to patient.     Randel Pigg, LPN   3/38/2505   Nurse Notes: none

## 2021-07-26 ENCOUNTER — Encounter: Payer: Self-pay | Admitting: Family Medicine

## 2021-07-27 NOTE — Telephone Encounter (Signed)
Please set this up for her

## 2021-07-30 ENCOUNTER — Other Ambulatory Visit: Payer: Self-pay

## 2021-07-30 DIAGNOSIS — Z780 Asymptomatic menopausal state: Secondary | ICD-10-CM

## 2021-07-30 NOTE — Telephone Encounter (Signed)
Pt referral for Bone Density was placed with [facility], pt was notified

## 2021-08-16 ENCOUNTER — Ambulatory Visit (INDEPENDENT_AMBULATORY_CARE_PROVIDER_SITE_OTHER): Payer: Medicare Other

## 2021-08-16 ENCOUNTER — Other Ambulatory Visit: Payer: Self-pay

## 2021-08-16 DIAGNOSIS — Z23 Encounter for immunization: Secondary | ICD-10-CM

## 2021-09-06 DIAGNOSIS — H9122 Sudden idiopathic hearing loss, left ear: Secondary | ICD-10-CM | POA: Diagnosis not present

## 2021-09-06 DIAGNOSIS — H8101 Meniere's disease, right ear: Secondary | ICD-10-CM | POA: Diagnosis not present

## 2021-09-07 ENCOUNTER — Encounter: Payer: Self-pay | Admitting: Family Medicine

## 2021-09-09 NOTE — Telephone Encounter (Signed)
No I never heard anything from him. She needs to see me sometime soon to follow up on the BP

## 2021-09-13 DIAGNOSIS — Z1231 Encounter for screening mammogram for malignant neoplasm of breast: Secondary | ICD-10-CM | POA: Diagnosis not present

## 2021-09-13 LAB — HM MAMMOGRAPHY

## 2021-09-15 ENCOUNTER — Encounter: Payer: Self-pay | Admitting: Family Medicine

## 2021-09-15 DIAGNOSIS — H9122 Sudden idiopathic hearing loss, left ear: Secondary | ICD-10-CM | POA: Diagnosis not present

## 2021-09-20 NOTE — Telephone Encounter (Signed)
Spoke with pt advised that Bone density orders have been sent to [facility] as requested. Pt is also aware that Dr Sarajane Jews advise to f/u for her BP,Pt state that she will call the office to schedule a f/u appointment

## 2021-09-22 DIAGNOSIS — H8101 Meniere's disease, right ear: Secondary | ICD-10-CM | POA: Diagnosis not present

## 2021-09-22 DIAGNOSIS — H9122 Sudden idiopathic hearing loss, left ear: Secondary | ICD-10-CM | POA: Diagnosis not present

## 2021-09-27 DIAGNOSIS — N6001 Solitary cyst of right breast: Secondary | ICD-10-CM | POA: Diagnosis not present

## 2021-09-27 DIAGNOSIS — R928 Other abnormal and inconclusive findings on diagnostic imaging of breast: Secondary | ICD-10-CM | POA: Diagnosis not present

## 2021-09-27 LAB — HM MAMMOGRAPHY

## 2021-09-30 ENCOUNTER — Encounter: Payer: Self-pay | Admitting: Family Medicine

## 2021-10-01 ENCOUNTER — Encounter: Payer: Self-pay | Admitting: Family Medicine

## 2021-10-01 ENCOUNTER — Ambulatory Visit (INDEPENDENT_AMBULATORY_CARE_PROVIDER_SITE_OTHER): Payer: Medicare Other | Admitting: Family Medicine

## 2021-10-01 VITALS — BP 130/68 | HR 79 | Temp 98.2°F | Wt 174.2 lb

## 2021-10-01 DIAGNOSIS — G8929 Other chronic pain: Secondary | ICD-10-CM | POA: Diagnosis not present

## 2021-10-01 DIAGNOSIS — I10 Essential (primary) hypertension: Secondary | ICD-10-CM

## 2021-10-01 DIAGNOSIS — M25552 Pain in left hip: Secondary | ICD-10-CM

## 2021-10-01 NOTE — Progress Notes (Signed)
   Subjective:    Patient ID: Pamela Daugherty, female    DOB: 01/19/1951, 70 y.o.   MRN: [id number]  HPI Here to follow up on HTN and to discuss left hip pain. She has been seeing ENT about her Meniere's disease, and they felt that a diuretic medication could help her. Therefore they stopped her Olmesartan HCT and changed it to Triamterene HCT. She thinks this has helped a little. Also her left hip has been more painful then usual for the past month. She takes only Tylenol for this since she cannot take NSAID's.    Review of Systems  Constitutional: Negative.   Respiratory: Negative.    Cardiovascular: Negative.   Musculoskeletal:  Positive for arthralgias.      Objective:   Physical Exam Constitutional:      Appearance: Normal appearance.  Cardiovascular:     Rate and Rhythm: Normal rate and regular rhythm.     Pulses: Normal pulses.     Heart sounds: Normal heart sounds.  Pulmonary:     Effort: Pulmonary effort is normal.     Breath sounds: Normal breath sounds.  Musculoskeletal:     Right lower leg: No edema.     Left lower leg: No edema.  Neurological:     General: No focal deficit present.     Mental Status: She is alert and oriented to person, place, and time.     Gait: Gait normal.          Assessment & Plan:  Her HTN is well controlled with the new medication, so we will stick with this. For the left hip pain, we will refer to Orthopedics.  Alysia Penna, MD

## 2021-10-06 DIAGNOSIS — L821 Other seborrheic keratosis: Secondary | ICD-10-CM | POA: Diagnosis not present

## 2021-10-06 DIAGNOSIS — B001 Herpesviral vesicular dermatitis: Secondary | ICD-10-CM | POA: Diagnosis not present

## 2021-10-06 DIAGNOSIS — L814 Other melanin hyperpigmentation: Secondary | ICD-10-CM | POA: Diagnosis not present

## 2021-10-06 DIAGNOSIS — Z872 Personal history of diseases of the skin and subcutaneous tissue: Secondary | ICD-10-CM | POA: Diagnosis not present

## 2021-10-06 DIAGNOSIS — D225 Melanocytic nevi of trunk: Secondary | ICD-10-CM | POA: Diagnosis not present

## 2021-10-08 DIAGNOSIS — N3020 Other chronic cystitis without hematuria: Secondary | ICD-10-CM | POA: Diagnosis not present

## 2021-10-14 DIAGNOSIS — M85852 Other specified disorders of bone density and structure, left thigh: Secondary | ICD-10-CM | POA: Diagnosis not present

## 2021-10-14 DIAGNOSIS — M810 Age-related osteoporosis without current pathological fracture: Secondary | ICD-10-CM | POA: Diagnosis not present

## 2021-10-14 DIAGNOSIS — M85851 Other specified disorders of bone density and structure, right thigh: Secondary | ICD-10-CM | POA: Diagnosis not present

## 2021-10-14 DIAGNOSIS — Z780 Asymptomatic menopausal state: Secondary | ICD-10-CM | POA: Diagnosis not present

## 2021-10-14 LAB — HM DEXA SCAN

## 2021-10-20 ENCOUNTER — Encounter: Payer: Self-pay | Admitting: Family Medicine

## 2021-10-21 ENCOUNTER — Encounter: Payer: Self-pay | Admitting: Family Medicine

## 2021-10-22 DIAGNOSIS — M519 Unspecified thoracic, thoracolumbar and lumbosacral intervertebral disc disorder: Secondary | ICD-10-CM | POA: Diagnosis not present

## 2021-10-22 DIAGNOSIS — M5451 Vertebrogenic low back pain: Secondary | ICD-10-CM | POA: Diagnosis not present

## 2021-10-22 NOTE — Telephone Encounter (Signed)
The report is not back yet

## 2021-10-28 ENCOUNTER — Telehealth: Payer: Self-pay

## 2021-10-28 ENCOUNTER — Other Ambulatory Visit: Payer: Self-pay

## 2021-10-28 MED ORDER — ALENDRONATE SODIUM 10 MG PO TABS: 10.0000 mg | ORAL_TABLET | Freq: Every day | ORAL | 3 refills | Status: DC

## 2021-10-28 NOTE — Telephone Encounter (Signed)
Reviewed bone density report with pt verbalized understanding, new Rx for Fosamax sent to pt pharmacy

## 2021-11-03 ENCOUNTER — Other Ambulatory Visit: Payer: Self-pay

## 2021-11-03 ENCOUNTER — Encounter: Payer: Self-pay | Admitting: Family Medicine

## 2021-11-03 DIAGNOSIS — F418 Other specified anxiety disorders: Secondary | ICD-10-CM

## 2021-11-03 DIAGNOSIS — I10 Essential (primary) hypertension: Secondary | ICD-10-CM

## 2021-11-03 MED ORDER — ESCITALOPRAM OXALATE 20 MG PO TABS: 20.0000 mg | ORAL_TABLET | Freq: Every day | ORAL | 1 refills | Status: DC

## 2021-11-03 MED ORDER — TRIAMTERENE-HCTZ 37.5-25 MG PO TABS: 1.0000 | ORAL_TABLET | Freq: Every day | ORAL | 1 refills | Status: DC

## 2021-11-11 ENCOUNTER — Other Ambulatory Visit: Payer: Self-pay

## 2021-11-11 ENCOUNTER — Ambulatory Visit: Payer: Medicare Other | Attending: Specialist | Admitting: Physical Therapy

## 2021-11-11 ENCOUNTER — Encounter: Payer: Self-pay | Admitting: Physical Therapy

## 2021-11-11 DIAGNOSIS — R252 Cramp and spasm: Secondary | ICD-10-CM | POA: Diagnosis not present

## 2021-11-11 DIAGNOSIS — M5441 Lumbago with sciatica, right side: Secondary | ICD-10-CM | POA: Diagnosis not present

## 2021-11-11 DIAGNOSIS — M6281 Muscle weakness (generalized): Secondary | ICD-10-CM

## 2021-11-11 NOTE — Patient Instructions (Signed)
Access Code: [email or web address] URL: [email or web address] Date: 11/11/2021 Prepared by: Almyra Free  Exercises Right Standing Lateral Shift Correction at [place] - 2 x daily - 7 x weekly - 1 sets - 10 reps - 5-10 sec hold Seated Hamstring Stretch with Chair - 2 x daily - 7 x weekly - 1 sets - 3 reps - 30-60 sec hold Supine Piriformis Stretch with Leg Straight - 2 x daily - 7 x weekly - 1 sets - 3 reps - 30 sec hold Supine Bridge - 2 x daily - 7 x weekly - 1-3 sets - 10 reps

## 2021-11-11 NOTE — Therapy (Signed)
[facility] [address]  [place], [place], [address] Phone: [phone]   Fax:  [phone]  Physical Therapy Evaluation  Patient Details  Name: Pamela Daugherty MRN: [id number] Date of Birth: 1951/05/21 Referring Provider (PT): Susa Day   Encounter Date: 11/11/2021   PT End of Session - 11/11/21 1348     Visit Number 1    Number of Visits 8    Date for PT Re-Evaluation 12/09/21    Authorization Type MCR    Progress Note Due on Visit 10    PT Start Time 2505    PT Stop Time 1430    PT Time Calculation (min) 42 min    Activity Tolerance Patient tolerated treatment well    Behavior During Therapy [facility] for tasks assessed/performed             Past Medical History:  Diagnosis Date   Anemia    Anxiety    B12 deficiency    Depression    Esophageal reflux    Esophageal stricture    Fatty liver    Hypertension    IBS (irritable bowel syndrome)    Insomnia    Meniere disease, bilateral    sees Dr. Anselm Jungling with [facility]   Neck pain    Routine gynecological examination    sees Dr. Alden Hipp     Past Surgical History:  Procedure Laterality Date   APPENDECTOMY     COLONOSCOPY  06/28/2019   per Dr. Havery Moros, benign polyps, repeat in 10 yrs    ESOPHAGOGASTRODUODENOSCOPY  06/28/2019   per Dr. Havery Moros, benign gastric polyp, source of GI blood loss    TONSILLECTOMY     TUBAL LIGATION      There were no vitals filed for this visit.    Subjective Assessment - 11/11/21 1350     Subjective Patient reports that if she is standing or walking for a period of time she gets a sharp pain in her right buttock then it will just be a dull ache. This started 2 months ago. The meds help. the pain refers to her knee. She has a history of low back pain when she cleans all day which she has had for years. Just diagnosed with OP.    Pertinent History scoliosis, osteoporosis    How long can you  stand comfortably? 30 min    How long can you walk comfortably? 30 min    Diagnostic tests xrays show scoliosis per pt    Patient Stated Goals Get rid of the pain    Currently in Pain? Yes    Pain Score 5     Pain Location Buttocks    Pain Orientation Right    Pain Descriptors / Indicators Aching;Dull    Pain Type Acute pain    Pain Radiating Towards right knee    Pain Onset More than a month ago    Pain Frequency Intermittent    Aggravating Factors  walking, standing    Pain Relieving Factors lying, sittiing, meds    Effect of Pain on Daily Activities limits daily walks, household chores                [facility] PT Assessment - 11/11/21 0001       Assessment   Medical Diagnosis vertebrogenic low back pain    Referring Provider (PT) Susa Day    Onset Date/Surgical Date 09/11/21    Hand Dominance Right  Prior Therapy no      Precautions   Precaution Comments osteoporosis      Restrictions   Weight Bearing Restrictions No      Balance Screen   Has the patient fallen in the past 6 months No    Has the patient had a decrease in activity level because of a fear of falling?  No    Is the patient reluctant to leave their home because of a fear of falling?  No      Home Ecologist residence    Additional Comments one level      Prior Function   Level of [place] Retired      Observation/Other Assessments   Focus on Therapeutic Outcomes (FOTO)  FS = 56 (67 predicted)      Posture/Postural Control   Posture Comments right lateral shift; otherwise even pelvic and scapular landmarks      ROM / Strength   AROM / PROM / Strength AROM;Strength      AROM   Overall AROM Comments lumbar WFL except bil rot 75%, left sidebending 1 inch less than right bringing hand to knee; bil hips and knees WFL      Strength   Overall Strength Comments Bil hip flex 4+/5, L hip ABD and ext 4/5, R hip ABD 4+/5; else BLE 5/5       Flexibility   Soft Tissue Assessment /Muscle Length yes    Hamstrings bil tightness R>L    Quadriceps WNL    ITB WNL    Piriformis mod right tightness    Quadratus Lumborum tight right and lumbar parspinals      Palpation   Spinal mobility WNL except tight and painful right L1/2 to L3/4; worst at L3/4      Special Tests   Other special tests + sciatic nerve tension right; negative FABER bil                        Objective measurements completed on examination: See above findings.                PT Education - 11/11/21 1541     Education Details HEP; POC; basic DN info; bed mobility, basic OP precautions    Person(s) Educated Patient    Methods Explanation;Demonstration;Handout    Comprehension Returned demonstration;Verbalized understanding              PT Short Term Goals - 11/11/21 1535       PT SHORT TERM GOAL #1   Title Ind with initial HEP    Time 1    Period Weeks    Status New    Target Date 11/18/21               PT Long Term Goals - 11/11/21 1536       PT LONG TERM GOAL #1   Title Patient to demonstrate normal postural alignment    Time 4    Period Weeks    Status New    Target Date 12/09/21      PT LONG TERM GOAL #2   Title Patient to report decreased pain in the right hip and leg by 75% or more with walking and standing.    Time 4    Period Weeks    Status New      PT LONG TERM GOAL #3   Title Patient to demonstrate improved  B hip strength to 4+/5 or better and/or be independent in HEP to progress this.    Time 4    Period Weeks    Status New      PT LONG TERM GOAL #4   Title improved FS score to 67 from 56 at intake demonstrating functional improvement    Time 4    Period Weeks    Status New                    Plan - 11/11/21 1434     Clinical Impression Statement Patient presents today with c/o right buttock and upper leg pain x 2 months. She thinks it may have started after lifting  something. She has a long h/o of low back pain but generally only after cleaning all day. She presents with a right lateral shift, tightness in her right piriformis and HS, and  tenderness with right UPA mobs at L3/4, L2/3 and L1/2. She has mild tightness in her right QL and lumbar paraspinals and subsequently mild deficits in lumbar left lateral flexion and bil rotation. She has bil hip weakness left> right. Patient was recently diagnosed with osteoporosis as well. She will benefit from skilled PT to address these deficits.    Stability/Clinical Decision Making Stable/Uncomplicated    Clinical Decision Making Low    Rehab Potential Excellent    PT Frequency 2x / week    PT Duration 4 weeks    PT Treatment/Interventions ADLs/Self Care Home Management;Cryotherapy;Electrical Stimulation;Moist Heat;Therapeutic exercise;Therapeutic activities;Patient/family education;Manual techniques;Dry needling;Taping    PT Next Visit Plan Review HEP and add strengthening; DN/manual to right QL/lumbar and right gluteus minimus; hip and core strength    PT Home Exercise Plan [email or web address]    Consulted and Agree with Plan of Care Patient             Patient will benefit from skilled therapeutic intervention in order to improve the following deficits and impairments:  Impaired flexibility, Pain, Postural dysfunction, Decreased strength, Increased muscle spasms, Decreased activity tolerance, Hypomobility  Visit Diagnosis: Acute right-sided low back pain with right-sided sciatica  Cramp and spasm  Muscle weakness (generalized)     Problem List Patient Active Problem List   Diagnosis Date Noted   UTI due to extended-spectrum beta lactamase (ESBL) producing Escherichia coli 01/18/2021   Nausea and vomiting 12/08/2011   Gastroparesis 12/08/2011   Constipation, slow transit 12/08/2011   Weight loss, abnormal 12/08/2011   TINNITUS 08/19/2009   VERTIGO 08/19/2009   Iron deficiency anemia 05/19/2009    ESOPHAGEAL STRICTURE 05/19/2009   IRRITABLE BOWEL SYNDROME 05/19/2009   FATTY LIVER DISEASE 05/19/2009   CONSTIPATION 03/19/2009   PARESTHESIA 03/19/2009   GERD 12/05/2008   Depression with anxiety 03/05/2008   BACK PAIN, THORACIC REGION 12/03/2007   Anxiety state 06/28/2007   Essential hypertension 06/28/2007   Madelyn Flavors, PT 11/11/2021, 3:48 PM  [facility] [address]  [place], [place], [address] Phone: [phone]   Fax:  [phone]  Name: Pamela Daugherty MRN: [id number] Date of Birth: 1951-04-08

## 2021-11-18 ENCOUNTER — Ambulatory Visit: Payer: Medicare Other | Admitting: Physical Therapy

## 2021-11-23 ENCOUNTER — Ambulatory Visit: Payer: Medicare Other | Admitting: Physical Therapy

## 2021-11-23 ENCOUNTER — Other Ambulatory Visit: Payer: Self-pay

## 2021-11-23 ENCOUNTER — Encounter: Payer: Self-pay | Admitting: Physical Therapy

## 2021-11-23 DIAGNOSIS — R252 Cramp and spasm: Secondary | ICD-10-CM | POA: Diagnosis not present

## 2021-11-23 DIAGNOSIS — M6281 Muscle weakness (generalized): Secondary | ICD-10-CM

## 2021-11-23 DIAGNOSIS — M5441 Lumbago with sciatica, right side: Secondary | ICD-10-CM | POA: Diagnosis not present

## 2021-11-23 NOTE — Therapy (Signed)
[facility] [address]  [place], [place], [address] Phone: [phone]   Fax:  [phone]  Physical Therapy Treatment  Patient Details  Name: Pamela Daugherty MRN: [id number] Date of Birth: 19-Aug-1951 Referring Provider (PT): Susa Day   Encounter Date: 11/23/2021   PT End of Session - 11/23/21 1350     Visit Number 2    Number of Visits 8    Date for PT Re-Evaluation 12/09/21    Authorization Type MCR    Progress Note Due on Visit 10    PT Start Time 3614    PT Stop Time 1432    PT Time Calculation (min) 44 min    Activity Tolerance Patient tolerated treatment well    Behavior During Therapy [facility] for tasks assessed/performed             Past Medical History:  Diagnosis Date   Anemia    Anxiety    B12 deficiency    Depression    Esophageal reflux    Esophageal stricture    Fatty liver    Hypertension    IBS (irritable bowel syndrome)    Insomnia    Meniere disease, bilateral    sees Dr. Anselm Jungling with [facility]   Neck pain    Routine gynecological examination    sees Dr. Alden Hipp     Past Surgical History:  Procedure Laterality Date   APPENDECTOMY     COLONOSCOPY  06/28/2019   per Dr. Havery Moros, benign polyps, repeat in 10 yrs    ESOPHAGOGASTRODUODENOSCOPY  06/28/2019   per Dr. Havery Moros, benign gastric polyp, source of GI blood loss    TONSILLECTOMY     TUBAL LIGATION      There were no vitals filed for this visit.   Subjective Assessment - 11/23/21 1351     Subjective The exercises help a lot.    Pertinent History scoliosis, osteoporosis    Patient Stated Goals Get rid of the pain    Currently in Pain? Yes    Pain Score 5     Pain Location Buttocks    Pain Orientation Right    Pain Descriptors / Indicators Aching;Dull                               [facility] Adult PT Treatment/Exercise - 11/23/21 0001       Exercises   Exercises  Lumbar      Lumbar Exercises: Stretches   Passive Hamstring Stretch Right;Left;1 rep;30 seconds    Figure 4 Stretch 1 rep;30 seconds;Seated    Figure 4 Stretch Limitations with fwd bend bil    Gastroc Stretch Right;Left;2 reps;30 seconds   VCs needed for perpendicular foot to wall   Gastroc Stretch Limitations also soleus x 2 ea bil    Other Lumbar Stretch Exercise right lateral glide x 5 5 sec hold to review HEP      Lumbar Exercises: Aerobic   Nustep L4 x 5 min      Manual Therapy   Manual Therapy Soft tissue mobilization;Joint mobilization    Manual therapy comments skilled palpation and monitoring of soft tissues during DN    Joint Mobilization UPA mobs Gd III to L1/2 to L3/4    Soft tissue mobilization to right lumbar and gluteals              Trigger Point Dry Needling -  11/23/21 0001     Consent Given? Yes    Education Handout Provided Yes    Muscles Treated Back/Hip Gluteus minimus;Gluteus medius;Gluteus maximus;Piriformis;Erector spinae;Lumbar multifidi;Quadratus lumborum    Dry Needling Comments right    Gluteus Minimus Response Twitch response elicited;Palpable increased muscle length    Gluteus Medius Response Twitch response elicited;Palpable increased muscle length    Gluteus Maximus Response Palpable increased muscle length    Piriformis Response Palpable increased muscle length    Erector spinae Response Palpable increased muscle length    Lumbar multifidi Response Twitch response elicited;Palpable increased muscle length    Quadratus Lumborum Response Twitch response elicited;Palpable increased muscle length                   PT Education - 11/23/21 1435     Education Details HEP progressed; DN handout/education and aftercare    Person(s) Educated Patient    Methods Explanation;Demonstration;Handout    Comprehension Verbalized understanding;Returned demonstration              PT Short Term Goals - 11/23/21 1441       PT SHORT TERM  GOAL #1   Title Ind with initial HEP    Status Achieved               PT Long Term Goals - 11/11/21 1536       PT LONG TERM GOAL #1   Title Patient to demonstrate normal postural alignment    Time 4    Period Weeks    Status New    Target Date 12/09/21      PT LONG TERM GOAL #2   Title Patient to report decreased pain in the right hip and leg by 75% or more with walking and standing.    Time 4    Period Weeks    Status New      PT LONG TERM GOAL #3   Title Patient to demonstrate improved B hip strength to 4+/5 or better and/or be independent in HEP to progress this.    Time 4    Period Weeks    Status New      PT LONG TERM GOAL #4   Title improved FS score to 67 from 56 at intake demonstrating functional improvement    Time 4    Period Weeks    Status New                   Plan - 11/23/21 1438     Clinical Impression Statement Patient reporting decreased pain overall since intial visit. She feels the lateral glides and bridging are really helping and visually her lateral shift was corrected today. Stretches progressed to include piriformis and gastroc/soleus today. Initial trial of DN to lumbar and right hip with very good responses throughout. Patient still with pain adn decreased UPA mobility prior to MT/DN at L1- L3/4. LTGs are ongoing.    PT Frequency 2x / week    PT Duration 4 weeks    PT Next Visit Plan Review HEP and add strengthening; assess DN/manual to right QL/lumbar and right gluteals, continue as indicated; hip and core strength; lifting and body mechanics    PT Home Exercise Plan [email or web address]    Consulted and Agree with Plan of Care Patient             Patient will benefit from skilled therapeutic intervention in order to improve the following deficits and impairments:  Impaired flexibility, Pain, Postural dysfunction, Decreased  strength, Increased muscle spasms, Decreased activity tolerance, Hypomobility  Visit Diagnosis: Acute  right-sided low back pain with right-sided sciatica  Cramp and spasm  Muscle weakness (generalized)     Problem List Patient Active Problem List   Diagnosis Date Noted   UTI due to extended-spectrum beta lactamase (ESBL) producing Escherichia coli 01/18/2021   Nausea and vomiting 12/08/2011   Gastroparesis 12/08/2011   Constipation, slow transit 12/08/2011   Weight loss, abnormal 12/08/2011   TINNITUS 08/19/2009   VERTIGO 08/19/2009   Iron deficiency anemia 05/19/2009   ESOPHAGEAL STRICTURE 05/19/2009   IRRITABLE BOWEL SYNDROME 05/19/2009   FATTY LIVER DISEASE 05/19/2009   CONSTIPATION 03/19/2009   PARESTHESIA 03/19/2009   GERD 12/05/2008   Depression with anxiety 03/05/2008   BACK PAIN, THORACIC REGION 12/03/2007   Anxiety state 06/28/2007   Essential hypertension 06/28/2007    Madelyn Flavors, PT 11/23/2021, 2:44 PM  [facility] [address]  [place], [place], [address] Phone: [phone]   Fax:  [phone]  Name: Pamela Daugherty MRN: [id number] Date of Birth: 1951-09-17

## 2021-11-23 NOTE — Patient Instructions (Signed)
Access Code: [email or web address] URL: [email or web address] Date: 11/23/2021 Prepared by: Almyra Free  Exercises Right Standing Lateral Shift Correction at Wall - Repetitions - 2 x daily - 7 x weekly - 1 sets - 10 reps - 5-10 sec hold Seated Hamstring Stretch with Chair - 2 x daily - 7 x weekly - 1 sets - 3 reps - 30-60 sec hold Supine Piriformis Stretch with Leg Straight - 2 x daily - 7 x weekly - 1 sets - 3 reps - 30 sec hold Supine Bridge - 2 x daily - 7 x weekly - 1-3 sets - 10 reps Standing Soleus Stretch - 2 x daily - 7 x weekly - 1 sets - 3 reps - 30-60 sec hold Standing Gastroc Stretch - 2 x daily - 7 x weekly - 1 sets - 3 reps - 30-60 sec hold Seated Piriformis Stretch with Trunk Bend - 2 x daily - 7 x weekly - 1 sets - 3 reps - 30-60 sec hold  Trigger Point Dry Needling  What is Trigger Point Dry Needling (DN)? DN is a physical therapy technique used to treat muscle pain and dysfunction. Specifically, DN helps deactivate muscle trigger points (muscle knots).  A thin filiform needle is used to penetrate the skin and stimulate the underlying trigger point. The goal is for a local twitch response (LTR) to occur and for the trigger point to relax. No medication of any kind is injected during the procedure.   What Does Trigger Point Dry Needling Feel Like?  The procedure feels different for each individual patient. Some patients report that they do not actually feel the needle enter the skin and overall the process is not painful. Very mild bleeding may occur. However, many patients feel a deep cramping in the muscle in which the needle was inserted. This is the local twitch response.   How Will I feel after the treatment? Soreness is normal, and the onset of soreness may not occur for a few hours. Typically this soreness does not last longer than two days.  Bruising is uncommon, however; ice can be used to decrease any possible bruising.  In rare cases feeling tired or nauseous after the  treatment is normal. In addition, your symptoms may get worse before they get better, this period will typically not last longer than 24 hours.   What Can I do After My Treatment? Increase your hydration by drinking more water for the next 24 hours. You may place ice or heat on the areas treated that have become sore, however, do not use heat on inflamed or bruised areas. Heat often brings more relief post needling. You can continue your regular activities, but vigorous activity is not recommended initially after the treatment for 24 hours. DN is best combined with other physical therapy such as strengthening, stretching, and other therapies.

## 2021-11-29 ENCOUNTER — Other Ambulatory Visit: Payer: Self-pay

## 2021-11-29 ENCOUNTER — Encounter: Payer: Self-pay | Admitting: Physical Therapy

## 2021-11-29 ENCOUNTER — Ambulatory Visit: Payer: Medicare Other | Attending: Specialist | Admitting: Physical Therapy

## 2021-11-29 DIAGNOSIS — M6281 Muscle weakness (generalized): Secondary | ICD-10-CM | POA: Diagnosis not present

## 2021-11-29 DIAGNOSIS — M5441 Lumbago with sciatica, right side: Secondary | ICD-10-CM | POA: Insufficient documentation

## 2021-11-29 DIAGNOSIS — R252 Cramp and spasm: Secondary | ICD-10-CM | POA: Insufficient documentation

## 2021-11-29 NOTE — Patient Instructions (Signed)
Access Code: [email or web address] URL: [email or web address] Date: 11/29/2021 Prepared by: Glenetta Hew  Exercises Right Standing Lateral Shift Correction at [place] - 2 x daily - 7 x weekly - 1 sets - 10 reps - 5-10 sec hold Seated Hamstring Stretch with Chair - 2 x daily - 7 x weekly - 1 sets - 3 reps - 30-60 sec hold Supine Piriformis Stretch with Leg Straight - 2 x daily - 7 x weekly - 1 sets - 3 reps - 30 sec hold Supine Bridge - 2 x daily - 7 x weekly - 1-3 sets - 10 reps Standing Soleus Stretch - 2 x daily - 7 x weekly - 1 sets - 3 reps - 30-60 sec hold Standing Gastroc Stretch - 2 x daily - 7 x weekly - 1 sets - 3 reps - 30-60 sec hold Seated Piriformis Stretch with Trunk Bend - 2 x daily - 7 x weekly - 1 sets - 3 reps - 30-60 sec hold Beginner Prone Single Leg Raise - 1 x daily - 7 x weekly - 2 sets - 10 reps

## 2021-11-29 NOTE — Therapy (Signed)
[facility] [address]  [place], [place], [address] Phone: [phone]   Fax:  [phone]  Physical Therapy Treatment  Patient Details  Name: Pamela Daugherty MRN: [id number] Date of Birth: [birth year] Referring Provider (PT): Susa Day   Encounter Date: 11/29/2021   PT End of Session - 11/29/21 1321     Visit Number 3    Number of Visits 8    Date for PT Re-Evaluation 12/09/21    Authorization Type MCR    Progress Note Due on Visit 10    PT Start Time 2952    PT Stop Time 1401    PT Time Calculation (min) 43 min    Activity Tolerance Patient tolerated treatment well    Behavior During Therapy [facility] for tasks assessed/performed             Past Medical History:  Diagnosis Date   Anemia    Anxiety    B12 deficiency    Depression    Esophageal reflux    Esophageal stricture    Fatty liver    Hypertension    IBS (irritable bowel syndrome)    Insomnia    Meniere disease, bilateral    sees Dr. Anselm Jungling with [facility] ENT   Neck pain    Routine gynecological examination    sees Dr. Alden Hipp     Past Surgical History:  Procedure Laterality Date   APPENDECTOMY     COLONOSCOPY  06/28/2019   per Dr. Havery Moros, benign polyps, repeat in 10 yrs    ESOPHAGOGASTRODUODENOSCOPY  06/28/2019   per Dr. Havery Moros, benign gastric polyp, source of GI blood loss    TONSILLECTOMY     TUBAL LIGATION      There were no vitals filed for this visit.   Subjective Assessment - 11/29/21 1320     Subjective Doing better, it just catches me every once in a while in the hip and sciatic area.    Pertinent History scoliosis, osteoporosis    Patient Stated Goals Get rid of the pain    Currently in Pain? No/denies                               [facility] Adult PT Treatment/Exercise - 11/29/21 0001       Exercises   Exercises Lumbar      Lumbar Exercises: Stretches   Passive  Hamstring Stretch Right;Left;30 seconds;2 reps    Figure 4 Stretch 2 reps;30 seconds    Figure 4 Stretch Limitations with fwd bend bil    Gastroc Stretch Right;Left;2 reps;30 seconds    Gastroc Stretch Limitations also soleus x 2 ea bil    Other Lumbar Stretch Exercise R lateral glide x 10    Other Lumbar Stretch Exercise lumbar extension on wall x 10      Lumbar Exercises: Aerobic   Nustep L5 x 6 min      Lumbar Exercises: Supine   Bridge 20 reps;3 seconds      Lumbar Exercises: Prone   Straight Leg Raise 10 reps    Straight Leg Raises Limitations bil      Manual Therapy   Manual Therapy Joint mobilization;Soft tissue mobilization    Joint Mobilization R UPA mobs to L2-L4 grade 3-4    Soft tissue mobilization IASTM with foam roller to R glutes, piriformis, QL lumbar paraspinals  PT Education - 11/29/21 1403     Education Details reviewed and progressed HEP, issued heel lift.    Person(s) Educated Patient    Methods Explanation;Demonstration;Verbal cues;Handout    Comprehension Verbalized understanding;Returned demonstration              PT Short Term Goals - 11/23/21 1441       PT SHORT TERM GOAL #1   Title Ind with initial HEP    Status Achieved               PT Long Term Goals - 11/29/21 1321       PT LONG TERM GOAL #1   Title Patient to demonstrate normal postural alignment    Time 4    Period Weeks    Status On-going   11/29/21- improved with R heel lift   Target Date 12/09/21      PT LONG TERM GOAL #2   Title Patient to report decreased pain in the right hip and leg by 75% or more with walking and standing.    Time 4    Period Weeks    Status On-going    Target Date 12/09/21      PT LONG TERM GOAL #3   Title Patient to demonstrate improved B hip strength to 4+/5 or better and/or be independent in HEP to progress this.    Time 4    Period Weeks    Status On-going    Target Date 12/09/21      PT LONG TERM GOAL  #4   Title improved FS score to 67 from 56 at intake demonstrating functional improvement    Time 4    Period Weeks    Status On-going    Target Date 12/09/21                   Plan - 11/29/21 1403     Clinical Impression Statement Pt. has been making good progress, reporting only intermittant pain in R buttock.  Today focused on reviewing exercises, did have to correct hold times and positioning.  Added prone extension as noted some atrophy in R glutes.  She demonstrated functional leg length discrepency as well, issued heel lift after which noted hips more level and improved gait.  She would benefit from continued skilled therapy.    PT Frequency 2x / week    PT Duration 4 weeks    PT Next Visit Plan Review HEP and add strengthening; assess DN/manual to right QL/lumbar and right gluteals, continue as indicated; hip and core strength; lifting and body mechanics    PT Home Exercise Plan [email or web address]    Consulted and Agree with Plan of Care Patient             Patient will benefit from skilled therapeutic intervention in order to improve the following deficits and impairments:  Impaired flexibility, Pain, Postural dysfunction, Decreased strength, Increased muscle spasms, Decreased activity tolerance, Hypomobility  Visit Diagnosis: Acute right-sided low back pain with right-sided sciatica  Cramp and spasm  Muscle weakness (generalized)     Problem List Patient Active Problem List   Diagnosis Date Noted   UTI due to extended-spectrum beta lactamase (ESBL) producing Escherichia coli 01/18/2021   Nausea and vomiting 12/08/2011   Gastroparesis 12/08/2011   Constipation, slow transit 12/08/2011   Weight loss, abnormal 12/08/2011   TINNITUS 08/19/2009   VERTIGO 08/19/2009   Iron deficiency anemia 05/19/2009   ESOPHAGEAL STRICTURE 05/19/2009   IRRITABLE BOWEL SYNDROME 05/19/2009  FATTY LIVER DISEASE 05/19/2009   CONSTIPATION 03/19/2009   PARESTHESIA 03/19/2009   GERD  12/05/2008   Depression with anxiety 03/05/2008   BACK PAIN, THORACIC REGION 12/03/2007   Anxiety state 06/28/2007   Essential hypertension 06/28/2007    Rennie Natter, PT, DPT  11/29/2021, 3:06 PM  [facility] [address]  [place], [place], [address] Phone: [phone]   Fax:  [phone]  Name: HENLEY BLYTH MRN: [id number] Date of Birth: 1951-10-22

## 2021-12-02 ENCOUNTER — Encounter: Payer: Self-pay | Admitting: Physical Therapy

## 2021-12-02 ENCOUNTER — Other Ambulatory Visit: Payer: Self-pay

## 2021-12-02 ENCOUNTER — Ambulatory Visit: Payer: Medicare Other | Admitting: Physical Therapy

## 2021-12-02 DIAGNOSIS — M6281 Muscle weakness (generalized): Secondary | ICD-10-CM

## 2021-12-02 DIAGNOSIS — M5441 Lumbago with sciatica, right side: Secondary | ICD-10-CM

## 2021-12-02 DIAGNOSIS — R252 Cramp and spasm: Secondary | ICD-10-CM

## 2021-12-02 NOTE — Patient Instructions (Signed)
Access Code: [email or web address] URL: [email or web address] Date: 12/02/2021 Prepared by: Glenetta Hew  Exercises Right Standing Lateral Shift Correction at [place] - 2 x daily - 7 x weekly - 1 sets - 10 reps - 5-10 sec hold Seated Hamstring Stretch with Chair - 2 x daily - 7 x weekly - 1 sets - 3 reps - 30-60 sec hold Supine Piriformis Stretch with Leg Straight - 2 x daily - 7 x weekly - 1 sets - 3 reps - 30 sec hold Standing Soleus Stretch - 2 x daily - 7 x weekly - 1 sets - 3 reps - 30-60 sec hold Standing Gastroc Stretch - 2 x daily - 7 x weekly - 1 sets - 3 reps - 30-60 sec hold Seated Piriformis Stretch with Trunk Bend - 2 x daily - 7 x weekly - 1 sets - 3 reps - 30-60 sec hold Beginner Prone Single Leg Raise - 1 x daily - 7 x weekly - 2-3 sets - 10 reps Bridge with Resistance - 1 x daily - 7 x weekly - 2-3 sets - 10 reps Clamshell with Resistance - 1 x daily - 7 x weekly - 2 sets - 10 reps Hooklying Clamshell with Resistance - 1 x daily - 7 x weekly - 2-3 sets - 10 reps

## 2021-12-02 NOTE — Therapy (Addendum)
PHYSICAL THERAPY DISCHARGE SUMMARY  Visits from Start of Care: 4  Current functional level related to goals / functional outcomes: Decreased pain, improved walking tolerance    Remaining deficits: See note below   Education / Equipment: HEP  Plan: Patient agrees to discharge.  Patient is being discharged due to self.  Patient requested further visits be cancelled on 12/10/21 as feeling much better.     Jena Gauss, PT, DPT 01/24/2021 9:06AM   [facility] [address]  [address] [place], [place], [address] Phone: [phone]   Fax:  [phone]  Physical Therapy Treatment  Patient Details  Name: Pamela Daugherty MRN: [id number] Date of Birth: May 22, 1951 Referring Provider (PT): Jene Every   Encounter Date: 12/02/2021   PT End of Session - 12/02/21 1321     Visit Number 4    Number of Visits 8    Date for PT Re-Evaluation 12/09/21    Authorization Type MCR    Progress Note Due on Visit 10    PT Start Time 1320    PT Stop Time 1359    PT Time Calculation (min) 39 min    Activity Tolerance Patient tolerated treatment well    Behavior During Therapy [facility] for tasks assessed/performed             Past Medical History:  Diagnosis Date   Anemia    Anxiety    B12 deficiency    Depression    Esophageal reflux    Esophageal stricture    Fatty liver    Hypertension    IBS (irritable bowel syndrome)    Insomnia    Meniere disease, bilateral    sees Dr. Julianne Rice with [facility]   Neck pain    Routine gynecological examination    sees Dr. Ilda Mori     Past Surgical History:  Procedure Laterality Date   APPENDECTOMY     COLONOSCOPY  06/28/2019   per Dr. Adela Lank, benign polyps, repeat in 10 yrs    ESOPHAGOGASTRODUODENOSCOPY  06/28/2019   per Dr. Adela Lank, benign gastric polyp, source of GI blood loss    TONSILLECTOMY     TUBAL LIGATION      There were no vitals filed for this  visit.   Subjective Assessment - 12/02/21 1322     Subjective Much better with that lift, I took a walk today and it didn't bother me.    Pertinent History scoliosis, osteoporosis    Patient Stated Goals Get rid of the pain    Currently in Pain? No/denies                               [facility] Adult PT Treatment/Exercise - 12/02/21 0001       Exercises   Exercises Lumbar      Lumbar Exercises: Stretches   Lower Trunk Rotation Limitations x 10 bil no hold      Lumbar Exercises: Aerobic   Nustep L6 x 6 min      Lumbar Exercises: Supine   Clam 20 reps    Clam Limitations RTB, cues for TrA contraction    Bridge 20 reps;3 seconds    Bridge with clamshell 20 reps    Bridge with Ball Squeeze Limitations RTB isometric hold    Straight Leg Raise 10 reps    Straight Leg Raises Limitations bil      Lumbar Exercises:  Sidelying   Clam Both;20 reps    Clam Limitations 2 x 10 bil RTB      Lumbar Exercises: Prone   Straight Leg Raise 20 reps    Straight Leg Raises Limitations bil      Manual Therapy   Manual Therapy Joint mobilization;Soft tissue mobilization;Myofascial release    Manual therapy comments to low back/glutes to decrease muscle spasm and pain    Joint Mobilization R UPA mobs to L2-L4 grade 3-4    Soft tissue mobilization IASTM with foam roller to R glutes, piriformis, QL lumbar paraspinals    Myofascial Release MFR to bil QL                     PT Education - 12/02/21 1401     Education Details HEP progression for glute strengthening; issued RTB    Person(s) Educated Patient    Methods Explanation;Demonstration;Verbal cues;Handout    Comprehension Verbalized understanding;Returned demonstration              PT Short Term Goals - 11/23/21 1441       PT SHORT TERM GOAL #1   Title Ind with initial HEP    Status Achieved               PT Long Term Goals - 11/29/21 1321       PT LONG TERM GOAL #1   Title Patient to  demonstrate normal postural alignment    Time 4    Period Weeks    Status On-going   11/29/21- improved with R heel lift   Target Date 12/09/21      PT LONG TERM GOAL #2   Title Patient to report decreased pain in the right hip and leg by 75% or more with walking and standing.    Time 4    Period Weeks    Status On-going    Target Date 12/09/21      PT LONG TERM GOAL #3   Title Patient to demonstrate improved B hip strength to 4+/5 or better and/or be independent in HEP to progress this.    Time 4    Period Weeks    Status On-going    Target Date 12/09/21      PT LONG TERM GOAL #4   Title improved FS score to 67 from 56 at intake demonstrating functional improvement    Time 4    Period Weeks    Status On-going    Target Date 12/09/21                   Plan - 12/02/21 1404     Clinical Impression Statement Pt. reports further decrease in pain with use of L heel lift, and demonstrated improving strength today with prone leg raises.  Continued to progress exercises with focus on glut strengthening.  She tolerated exercises well, reporting some "pulling" in R buttock but no pain.  Noted less tenderness and tightness in LB/glutes with manual therapy.    PT Frequency 2x / week    PT Duration 4 weeks    PT Next Visit Plan Review HEP and add strengthening; assess DN/manual to right QL/lumbar and right gluteals, continue as indicated; hip and core strength; lifting and body mechanics    PT Home Exercise Plan [id number]    Consulted and Agree with Plan of Care Patient             Patient will benefit from skilled therapeutic intervention in  order to improve the following deficits and impairments:  Impaired flexibility, Pain, Postural dysfunction, Decreased strength, Increased muscle spasms, Decreased activity tolerance, Hypomobility  Visit Diagnosis: Acute right-sided low back pain with right-sided sciatica  Cramp and spasm  Muscle weakness (generalized)     Problem  List Patient Active Problem List   Diagnosis Date Noted   UTI due to extended-spectrum beta lactamase (ESBL) producing Escherichia coli 01/18/2021   Nausea and vomiting 12/08/2011   Gastroparesis 12/08/2011   Constipation, slow transit 12/08/2011   Weight loss, abnormal 12/08/2011   TINNITUS 08/19/2009   VERTIGO 08/19/2009   Iron deficiency anemia 05/19/2009   ESOPHAGEAL STRICTURE 05/19/2009   IRRITABLE BOWEL SYNDROME 05/19/2009   FATTY LIVER DISEASE 05/19/2009   CONSTIPATION 03/19/2009   PARESTHESIA 03/19/2009   GERD 12/05/2008   Depression with anxiety 03/05/2008   BACK PAIN, THORACIC REGION 12/03/2007   Anxiety state 06/28/2007   Essential hypertension 06/28/2007    Jena Gauss, PT, DPT  12/02/2021, 2:06 PM  [facility] [address]  [address] [place], [place], [address] Phone: [phone]   Fax:  [phone]  Name: Pamela Daugherty MRN: [id number] Date of Birth: 09-26-1951

## 2021-12-07 ENCOUNTER — Ambulatory Visit: Payer: Medicare Other | Admitting: Physical Therapy

## 2021-12-09 ENCOUNTER — Encounter: Payer: Medicare Other | Admitting: Physical Therapy

## 2021-12-10 ENCOUNTER — Encounter: Payer: Self-pay | Admitting: Physical Therapy

## 2021-12-10 ENCOUNTER — Encounter: Payer: Self-pay | Admitting: Family Medicine

## 2021-12-10 NOTE — Telephone Encounter (Signed)
Spoke with pt advised of the mammogram report, pt state that she will call [facility] to get a breakdown of her Dexa Scan report

## 2021-12-13 ENCOUNTER — Encounter: Payer: Self-pay | Admitting: Family Medicine

## 2021-12-14 ENCOUNTER — Encounter: Payer: Medicare Other | Admitting: Physical Therapy

## 2021-12-16 ENCOUNTER — Encounter: Payer: Medicare Other | Admitting: Physical Therapy

## 2021-12-29 DIAGNOSIS — H93A9 Pulsatile tinnitus, unspecified ear: Secondary | ICD-10-CM | POA: Diagnosis not present

## 2021-12-29 DIAGNOSIS — H93A1 Pulsatile tinnitus, right ear: Secondary | ICD-10-CM | POA: Diagnosis not present

## 2021-12-29 DIAGNOSIS — R9089 Other abnormal findings on diagnostic imaging of central nervous system: Secondary | ICD-10-CM | POA: Diagnosis not present

## 2022-03-30 ENCOUNTER — Encounter: Payer: Self-pay | Admitting: Family Medicine

## 2022-03-30 DIAGNOSIS — F418 Other specified anxiety disorders: Secondary | ICD-10-CM

## 2022-03-30 DIAGNOSIS — I10 Essential (primary) hypertension: Secondary | ICD-10-CM

## 2022-03-30 MED ORDER — ALPRAZOLAM 0.5 MG PO TABS: 0.5000 mg | ORAL_TABLET | Freq: Every evening | ORAL | 1 refills | Status: DC | PRN

## 2022-03-30 MED ORDER — TRIAMTERENE-HCTZ 37.5-25 MG PO TABS: 1.0000 | ORAL_TABLET | Freq: Every day | ORAL | 0 refills | Status: DC

## 2022-03-30 MED ORDER — TRIAMTERENE-HCTZ 37.5-25 MG PO TABS: 1.0000 | ORAL_TABLET | Freq: Every day | ORAL | 3 refills | Status: DC

## 2022-03-30 MED ORDER — ESCITALOPRAM OXALATE 20 MG PO TABS: 20.0000 mg | ORAL_TABLET | Freq: Every day | ORAL | 0 refills | Status: DC

## 2022-03-30 MED ORDER — ESCITALOPRAM OXALATE 20 MG PO TABS: 20.0000 mg | ORAL_TABLET | Freq: Every day | ORAL | 3 refills | Status: DC

## 2022-03-30 NOTE — Telephone Encounter (Signed)
Done

## 2022-06-29 DIAGNOSIS — R079 Chest pain, unspecified: Secondary | ICD-10-CM

## 2022-07-08 ENCOUNTER — Ambulatory Visit (INDEPENDENT_AMBULATORY_CARE_PROVIDER_SITE_OTHER): Payer: Medicare Other | Admitting: Family Medicine

## 2022-07-08 ENCOUNTER — Encounter: Payer: Self-pay | Admitting: Family Medicine

## 2022-07-08 VITALS — BP 128/90 | HR 76 | Temp 98.1°F | Ht 67.5 in | Wt 173.4 lb

## 2022-07-08 DIAGNOSIS — F418 Other specified anxiety disorders: Secondary | ICD-10-CM

## 2022-07-08 DIAGNOSIS — I10 Essential (primary) hypertension: Secondary | ICD-10-CM

## 2022-07-08 DIAGNOSIS — M546 Pain in thoracic spine: Secondary | ICD-10-CM | POA: Diagnosis not present

## 2022-07-08 DIAGNOSIS — Z23 Encounter for immunization: Secondary | ICD-10-CM

## 2022-07-08 DIAGNOSIS — K3184 Gastroparesis: Secondary | ICD-10-CM

## 2022-07-08 DIAGNOSIS — K219 Gastro-esophageal reflux disease without esophagitis: Secondary | ICD-10-CM

## 2022-07-08 DIAGNOSIS — R079 Chest pain, unspecified: Secondary | ICD-10-CM | POA: Diagnosis not present

## 2022-07-08 DIAGNOSIS — R42 Dizziness and giddiness: Secondary | ICD-10-CM | POA: Diagnosis not present

## 2022-07-08 DIAGNOSIS — D509 Iron deficiency anemia, unspecified: Secondary | ICD-10-CM | POA: Diagnosis not present

## 2022-07-08 DIAGNOSIS — R739 Hyperglycemia, unspecified: Secondary | ICD-10-CM

## 2022-07-08 DIAGNOSIS — K5901 Slow transit constipation: Secondary | ICD-10-CM | POA: Diagnosis not present

## 2022-07-08 LAB — CBC WITH DIFFERENTIAL/PLATELET
Basophils Absolute: 0 10*3/uL (ref 0.0–0.1)
Basophils Relative: 0.6 % (ref 0.0–3.0)
Eosinophils Absolute: 0.2 10*3/uL (ref 0.0–0.7)
Eosinophils Relative: 3.2 % (ref 0.0–5.0)
HCT: 40.7 % (ref 36.0–46.0)
Hemoglobin: 13.9 g/dL (ref 12.0–15.0)
Lymphocytes Relative: 38 % (ref 12.0–46.0)
Lymphs Abs: 2.8 10*3/uL (ref 0.7–4.0)
MCHC: 34.2 g/dL (ref 30.0–36.0)
MCV: 83.9 fl (ref 78.0–100.0)
Monocytes Absolute: 0.6 10*3/uL (ref 0.1–1.0)
Monocytes Relative: 7.6 % (ref 3.0–12.0)
Neutro Abs: 3.8 10*3/uL (ref 1.4–7.7)
Neutrophils Relative %: 50.6 % (ref 43.0–77.0)
Platelets: 234 10*3/uL (ref 150.0–400.0)
RBC: 4.85 Mil/uL (ref 3.87–5.11)
RDW: 13.3 % (ref 11.5–15.5)
WBC: 7.4 10*3/uL (ref 4.0–10.5)

## 2022-07-08 LAB — HEPATIC FUNCTION PANEL
ALT: 11 U/L (ref 0–35)
AST: 21 U/L (ref 0–37)
Albumin: 4.2 g/dL (ref 3.5–5.2)
Alkaline Phosphatase: 49 U/L (ref 39–117)
Bilirubin, Direct: 0.1 mg/dL (ref 0.0–0.3)
Total Bilirubin: 0.6 mg/dL (ref 0.2–1.2)
Total Protein: 7 g/dL (ref 6.0–8.3)

## 2022-07-08 LAB — BASIC METABOLIC PANEL
BUN: 10 mg/dL (ref 6–23)
CO2: 30 mEq/L (ref 19–32)
Calcium: 9.5 mg/dL (ref 8.4–10.5)
Chloride: 97 mEq/L (ref 96–112)
Creatinine, Ser: 0.87 mg/dL (ref 0.40–1.20)
GFR: 67.29 mL/min (ref >60.00)
Glucose, Bld: 84 mg/dL (ref 70–99)
Potassium: 3.6 mEq/L (ref 3.5–5.1)
Sodium: 135 mEq/L (ref 135–145)

## 2022-07-08 LAB — LIPID PANEL
Cholesterol: 205 mg/dL — ABNORMAL HIGH (ref 0–200)
HDL: 52.8 mg/dL (ref >39.00)
LDL Cholesterol: 116 mg/dL — ABNORMAL HIGH (ref 0–99)
NonHDL: 151.97
Total CHOL/HDL Ratio: 4
Triglycerides: 179 mg/dL — ABNORMAL HIGH (ref 0.0–149.0)
VLDL: 35.8 mg/dL (ref 0.0–40.0)

## 2022-07-08 LAB — TSH: TSH: 2.71 u[IU]/mL (ref 0.35–5.50)

## 2022-07-08 LAB — HEMOGLOBIN A1C: Hgb A1c MFr Bld: 5.7 % (ref 4.6–6.5)

## 2022-07-08 MED ORDER — GABAPENTIN 300 MG PO CAPS: 300.0000 mg | ORAL_CAPSULE | Freq: Every day | ORAL | 3 refills | Status: DC

## 2022-07-08 MED ORDER — FAMOTIDINE 20 MG PO TABS: 20.0000 mg | ORAL_TABLET | Freq: Every evening | ORAL | 2 refills | Status: DC

## 2022-07-08 NOTE — Progress Notes (Signed)
Subjective:    Patient ID: Pamela Daugherty, female    DOB: June 13, 1951, 71 y.o.   MRN: [id number]  HPI Here to follow up on issues. She has been doing well until about 2 weeks ago. At that time she began to experience episodes of chest pain that have been occurring several tmes every day. These rate as a 6 out of 10 in terms of severity. She describes them as "squeezing" in nature. They last about 15 minutes and then go away. Sometimes the pain will radiate through the chest to the middle back. No radiation to the jaws or the arms. She denies any SOB or nausea. The pains are not associated with eating or with exertion. Of note she had stopped taking Nexium about a year ago because her heartburn had done away, but she started taking it again when these pains started. Otherwise her BP is stable. Her vertigo and constipation are stable.    Review of Systems  Constitutional: Negative.   HENT: Negative.    Eyes: Negative.   Respiratory: Negative.    Cardiovascular:  Positive for chest pain. Negative for palpitations and leg swelling.  Gastrointestinal: Negative.   Genitourinary:  Negative for decreased urine volume, difficulty urinating, dyspareunia, dysuria, enuresis, flank pain, frequency, hematuria, pelvic pain and urgency.  Musculoskeletal: Negative.   Skin: Negative.   Neurological: Negative.  Negative for headaches.  Psychiatric/Behavioral: Negative.          Objective:   Physical Exam Constitutional:      General: She is not in acute distress.    Appearance: Normal appearance. She is well-developed.  HENT:     Head: Normocephalic and atraumatic.     Right Ear: External ear normal.     Left Ear: External ear normal.     Nose: Nose normal.     Mouth/Throat:     Pharynx: No oropharyngeal exudate.  Eyes:     General: No scleral icterus.    Conjunctiva/sclera: Conjunctivae normal.     Pupils: Pupils are equal, round, and reactive to light.  Neck:     Thyroid: No thyromegaly.      Vascular: No JVD.  Cardiovascular:     Rate and Rhythm: Normal rate and regular rhythm.     Heart sounds: Normal heart sounds. No murmur heard.    No friction rub. No gallop.     Comments: EKG today is normal  Pulmonary:     Effort: Pulmonary effort is normal. No respiratory distress.     Breath sounds: Normal breath sounds. No wheezing or rales.  Chest:     Chest wall: No tenderness.  Abdominal:     General: Bowel sounds are normal. There is no distension.     Palpations: Abdomen is soft. There is no mass.     Tenderness: There is no abdominal tenderness. There is no guarding or rebound.  Musculoskeletal:        General: No tenderness. Normal range of motion.     Cervical back: Normal range of motion and neck supple.  Lymphadenopathy:     Cervical: No cervical adenopathy.  Skin:    General: Skin is warm and dry.     Findings: No erythema or rash.  Neurological:     General: No focal deficit present.     Mental Status: She is alert and oriented to person, place, and time.     Cranial Nerves: No cranial nerve deficit.     Motor: No abnormal muscle tone.  Coordination: Coordination normal.     Deep Tendon Reflexes: Reflexes are normal and symmetric. Reflexes normal.  Psychiatric:        Mood and Affect: Mood normal.        Behavior: Behavior normal.        Thought Content: Thought content normal.        Judgment: Judgment normal.           Assessment & Plan:  She is having chest pains which are likely caused by esophageal spasms related to GERD. She will stay on Nexium 20 mg every morning, and we will add Pepcid 20 mg every evening. To cover the possibility of coronary disease however, we will also arrange for her to have a myocardial perfusion study sometime soon. Her HTN and depression with anxiety are stable. We will get fasting labs to check lipids, CBC, etc. We spent a total of (  35 ) minutes reviewing records and discussing these issues.  Alysia Penna, MD

## 2022-07-08 NOTE — Addendum Note (Signed)
Addended by: Wyvonne Lenz on: 07/08/2022 11:42 AM   Modules accepted: Orders

## 2022-07-11 NOTE — Telephone Encounter (Signed)
-----   Message from Frederic Jericho sent at 07/08/2022  2:00 PM EDT ----- Please create an Order for an ATTESTATION ( [id number]) for ordered Myoview/GXT/Stress Echocardiogram.  This must be signed by ordering Provider.  We do not accept verbal cosign.  This test will not be scheduled until ATT is obtained.

## 2022-07-12 ENCOUNTER — Telehealth (HOSPITAL_COMMUNITY): Payer: Self-pay | Admitting: *Deleted

## 2022-07-12 NOTE — Telephone Encounter (Signed)
Patient given detailed instructions per Myocardial Perfusion Study Information Sheet for the test on  07/13/22 Patient notified to arrive 15 minutes early and that it is imperative to arrive on time for appointment to keep from having the test rescheduled.  If you need to cancel or reschedule your appointment, please call the office within 24 hours of your appointment. . Patient verbalized understanding. Pamela Daugherty Jacqueline   

## 2022-07-13 ENCOUNTER — Encounter: Payer: Self-pay | Admitting: Family Medicine

## 2022-07-13 ENCOUNTER — Ambulatory Visit (HOSPITAL_COMMUNITY): Payer: Medicare Other | Attending: Family Medicine

## 2022-07-13 DIAGNOSIS — R079 Chest pain, unspecified: Secondary | ICD-10-CM | POA: Diagnosis not present

## 2022-07-13 LAB — MYOCARDIAL PERFUSION IMAGING
Estimated workload: 8.5
Exercise duration (min): 7 min
Exercise duration (sec): 1 s
LV dias vol: 58 mL (ref 46–106)
LV sys vol: 14 mL
MPHR: 150 {beats}/min
Nuc Stress EF: 76 %
Peak HR: 142 {beats}/min
Percent HR: 92 %
RPE: 18
Rest HR: 61 {beats}/min
Rest Nuclear Isotope Dose: 10.4 mCi
SDS: 0
SRS: 0
SSS: 0
ST Depression (mm): 0 mm
Stress Nuclear Isotope Dose: 31.3 mCi
TID: 0.75

## 2022-07-13 MED ORDER — TECHNETIUM TC 99M TETROFOSMIN IV KIT
31.3000 | PACK | Freq: Once | INTRAVENOUS | Status: AC | PRN
  Administered 2022-07-13: 31.3 via INTRAVENOUS

## 2022-07-13 MED ORDER — TECHNETIUM TC 99M TETROFOSMIN IV KIT
10.4000 | PACK | Freq: Once | INTRAVENOUS | Status: AC | PRN
  Administered 2022-07-13: 10.4 via INTRAVENOUS

## 2022-07-14 NOTE — Telephone Encounter (Signed)
Limit your intake of dairy products, and when you do use them make sure they are "low fat". Also avoid any fried foods (like meats, fries, chips). Avoid heavy sauces and gravy. Avoid fatty meats like bacon and sausage

## 2022-07-20 ENCOUNTER — Ambulatory Visit: Payer: Medicare Other

## 2022-07-22 ENCOUNTER — Telehealth: Payer: Self-pay | Admitting: Family Medicine

## 2022-07-22 NOTE — Telephone Encounter (Signed)
Left message for patient to call back and schedule Medicare Annual Wellness Visit (AWV) either virtually or in office. Left  my jabber number [phone]   Last AWV 07/16/21 please schedule with Nurse Health Adviser   45 min for awv-i and in office appointments 30 min for awv-s  phone/virtual appointments  

## 2022-07-26 ENCOUNTER — Ambulatory Visit (INDEPENDENT_AMBULATORY_CARE_PROVIDER_SITE_OTHER): Payer: Medicare Other

## 2022-07-26 VITALS — Ht 67.5 in | Wt 173.0 lb

## 2022-07-26 DIAGNOSIS — Z0000 Encounter for general adult medical examination without abnormal findings: Secondary | ICD-10-CM

## 2022-07-26 NOTE — Progress Notes (Signed)
Subjective:   Pamela Daugherty is a 71 y.o. female who presents for Medicare Annual (Subsequent) preventive examination.  Review of Systems    Virtual Visit via Telephone Note  I connected with  Pamela Daugherty on 07/26/22 at 10:15 AM EDT by telephone and verified that I am speaking with the correct person using two identifiers.  Location: Patient: Home Provider: Office Persons participating in the virtual visit: patient/Nurse Health Advisor   I discussed the limitations, risks, security and privacy concerns of performing an evaluation and management service by telephone and the availability of in person appointments. The patient expressed understanding and agreed to proceed.  Interactive audio and video telecommunications were attempted between this nurse and patient, however failed, due to patient having technical difficulties OR patient did not have access to video capability.  We continued and completed visit with audio only.  Some vital signs may be absent or patient reported.   [name], LPN  Cardiac Risk Factors include: advanced age (>6mn, >>40women);hypertension     Objective:    Today's Vitals   07/26/22 1021  Weight: 173 lb (78.5 kg)  Height: 5' 7.5" (1.715 m)   Body mass index is 26.7 kg/m.     07/26/2022   10:29 AM 11/11/2021    1:50 PM 07/16/2021   10:40 AM 07/10/2020   10:44 AM  Advanced Directives  Does Patient Have a Medical Advance Directive? No No No No  Would patient like information on creating a medical advance directive? No - Patient declined No - Patient declined No - Patient declined No - Patient declined    Current Medications (verified) Outpatient Encounter Medications as of 07/26/2022  Medication Sig   alendronate (FOSAMAX) 10 MG tablet Take 1 tablet (10 mg total) by mouth daily before breakfast. Take with a full glass of water on an empty stomach.   ALPRAZolam (XANAX) 0.5 MG tablet Take 1 tablet (0.5 mg total) by mouth at bedtime as  needed for sleep.   aspirin 81 MG EC tablet Take 81 mg by mouth daily.   Clobetasol Propionate Emulsion 0.05 % topical foam Apply 1 application topically 2 (two) times daily.   Cranberry 250 MG TABS Take 250 mg by mouth in the morning and at bedtime.   D-Mannose 500 MG CAPS Take 500 mg by mouth daily.   escitalopram (LEXAPRO) 20 MG tablet Take 1 tablet (20 mg total) by mouth daily.   esomeprazole (NEXIUM) 20 MG capsule Take 1 capsule (20 mg total) by mouth daily at 12 noon.   estradiol (ESTRACE) 0.1 MG/GM vaginal cream SMARTSIG:Gram(s) Vaginal Daily   famotidine (PEPCID) 20 MG tablet Take 1 tablet (20 mg total) by mouth every evening.   fish oil-omega-3 fatty acids 1000 MG capsule Take 1 g by mouth daily.   fluticasone (FLONASE) 50 MCG/ACT nasal spray Place 1 spray into both nostrils daily.   gabapentin (NEURONTIN) 300 MG capsule Take 1 capsule (300 mg total) by mouth at bedtime.   Garlic 14034MG CAPS Take 1,000 mg by mouth daily.   ketoconazole (NIZORAL) 2 % cream    meclizine (ANTIVERT) 25 MG tablet Take 1 tablet (25 mg total) by mouth every 4 (four) hours as needed for dizziness.   Probiotic Product (PROBIOTIC PO) Take 1 capsule by mouth daily.   triamterene-hydrochlorothiazide (MAXZIDE-25) 37.5-25 MG tablet Take 1 tablet by mouth daily.   valACYclovir (VALTREX) 500 MG tablet Take 1,000 mg by mouth 2 (two) times daily.   vitamin B-12 (  CYANOCOBALAMIN) 250 MCG tablet Take 250 mcg by mouth daily.   vitamin E 600 UNIT capsule Take 600 Units by mouth daily.   Zinc 50 MG TABS Take by mouth.   No facility-administered encounter medications on file as of 07/26/2022.    Allergies (verified) Penicillins   History: Past Medical History:  Diagnosis Date   Anemia    Anxiety    B12 deficiency    Depression    Esophageal reflux    Esophageal stricture    Fatty liver    Hypertension    IBS (irritable bowel syndrome)    Insomnia    Meniere disease, bilateral    sees Dr. Anselm Jungling  with [facility]   Neck pain    Routine gynecological examination    sees Dr. Alden Hipp    Past Surgical History:  Procedure Laterality Date   APPENDECTOMY     COLONOSCOPY  06/28/2019   per Dr. Havery Moros, benign polyps, repeat in 10 yrs    ESOPHAGOGASTRODUODENOSCOPY  06/28/2019   per Dr. Havery Moros, benign gastric polyp, source of GI blood loss    TONSILLECTOMY     TUBAL LIGATION     Family History  Problem Relation Age of Onset   Coronary artery disease Mother    Diabetes Mother    Irritable bowel syndrome Mother    Heart failure Mother    Healthy Father    Colon cancer Neg Hx    Esophageal cancer Neg Hx    Rectal cancer Neg Hx    Stomach cancer Neg Hx    Social History   Socioeconomic History   Marital status: Widowed    Spouse name: Not on file   Number of children: Not on file   Years of education: Not on file   Highest education level: 12th grade  Occupational History   Occupation: adjuster  Tobacco Use   Smoking status: Never   Smokeless tobacco: Never  Substance and Sexual Activity   Alcohol use: Yes    Alcohol/week: 2.0 standard drinks of alcohol    Types: 2 Standard drinks or equivalent per week   Drug use: No   Sexual activity: Not Currently  Other Topics Concern   Not on file  Social History Narrative   Not on file   Social Determinants of Health   Financial Resource Strain: Low Risk  (07/26/2022)   Overall Financial Resource Strain (CARDIA)    Difficulty of Paying Living Expenses: Not hard at all  Food Insecurity: No Food Insecurity (07/26/2022)   Hunger Vital Sign    Worried About Running Out of Food in the Last Year: Never true    Ran Out of Food in the Last Year: Never true  Transportation Needs: No Transportation Needs (07/26/2022)   PRAPARE - Hydrologist (Medical): No    Lack of Transportation (Non-Medical): No  Physical Activity: Insufficiently Active (07/26/2022)   Exercise Vital Sign    Days of  Exercise per Week: 3 days    Minutes of Exercise per Session: 20 min  Stress: No Stress Concern Present (07/26/2022)   [place]    Feeling of Stress : Not at all  Social Connections: Moderately Integrated (07/26/2022)   Social Connection and Isolation Panel [NHANES]    Frequency of Communication with Friends and Family: More than three times a week    Frequency of Social Gatherings with Friends and Family: More than three times a  week    Attends Religious Services: More than 4 times per year    Active Member of Clubs or Organizations: Yes    Attends Archivist Meetings: More than 4 times per year    Marital Status: Widowed    Tobacco Counseling Counseling given: Not Answered   Clinical Intake:  Pre-visit preparation completed: Yes  Pain : No/denies pain     BMI - recorded: 26.7 Nutritional Status: BMI 25 -29 Overweight Nutritional Risks: None  How often do you need to have someone help you when you read instructions, pamphlets, or other written materials from your doctor or pharmacy?: 1 - Never  Diabetic?  No  Interpreter Needed?: No  Information entered by :: Rolene Arbour LPN   Activities of Daily Living    07/26/2022   10:27 AM 07/22/2022   12:43 PM  In your present state of health, do you have any difficulty performing the following activities:  Hearing? 0 0  Vision? 0 0  Difficulty concentrating or making decisions? 0 0  Walking or climbing stairs? 0 0  Dressing or bathing? 0 0  Doing errands, shopping? 0 0  Preparing Food and eating ? N N  Using the Toilet? N N  In the past six months, have you accidently leaked urine? N N  Do you have problems with loss of bowel control? N N  Managing your Medications? N N  Managing your Finances? N N  Housekeeping or managing your Housekeeping? N N    Patient Care Team: Laurey Morale, MD as PCP - General Lovena Neighbours Conception Oms, MD as  Consulting Physician (Urology)  Indicate any recent Medical Services you may have received from other than [facility] providers in the past year (date may be approximate).     Assessment:   This is a routine wellness examination for Lynnann.  Hearing/Vision screen Hearing Screening - Comments:: Denies hearing difficulties   Vision Screening - Comments:: Wears rx glasses - up to date with routine eye exams with  Dr Bing Plume  Dietary issues and exercise activities discussed: Current Exercise Habits: Home exercise routine, Type of exercise: walking, Time (Minutes): 20, Frequency (Times/Week): 3, Weekly Exercise (Minutes/Week): 60, Intensity: Moderate, Exercise limited by: None identified   Goals Addressed               This Visit's Progress     Stay Healthy (pt-stated)         Depression Screen    07/26/2022   10:26 AM 07/08/2022   10:06 AM 10/01/2021   10:22 AM 07/16/2021   10:42 AM 07/16/2021   10:39 AM 06/22/2021    8:53 AM 01/18/2021   10:01 AM  PHQ 2/9 Scores  PHQ - 2 Score 0 0 2 0 0 0 0  PHQ- 9 Score 0 0 6   2     Fall Risk    07/26/2022   10:28 AM 07/22/2022   12:43 PM 07/08/2022   10:05 AM 09/28/2021    6:40 PM 07/16/2021   10:41 AM  Fall Risk   Falls in the past year? 1 0 0 0 0  Number falls in past yr: 0  0  0  Injury with Fall? 0  0  0  Risk for fall due to : No Fall Risks  No Fall Risks  No Fall Risks  Follow up Falls prevention discussed  Falls evaluation completed  Falls evaluation completed    [place]:  Any stairs  in or around the home? No  If so, are there any without handrails? No  Home free of loose throw rugs in walkways, pet beds, electrical cords, etc? Yes  Adequate lighting in your home to reduce risk of falls? Yes   ASSISTIVE DEVICES UTILIZED TO PREVENT FALLS:  Life alert? No  Use of a cane, walker or w/c? No  Grab bars in the bathroom? No  Shower chair or bench in shower? No  Elevated toilet seat or a handicapped  toilet? Yes  TIMED UP AND GO:  Was the test performed? No . Audio Visit  Cognitive Function:        07/26/2022   10:30 AM  6CIT Screen  What Year? 0 points  What month? 0 points  What time? 0 points  Count back from 20 2 points  Months in reverse 0 points  Repeat phrase 0 points  Total Score 2 points    Immunizations Immunization History  Administered Date(s) Administered   Fluad Quad(high Dose 65+) 08/31/2019, 08/31/2020, 07/08/2022   Influenza, High Dose Seasonal PF 11/29/2016, 12/26/2017, 11/06/2018   Influenza,inj,Quad PF,6+ Mos 10/11/2013, 10/13/2014, 10/21/2015, 08/16/2021   Influenza-Unspecified 11/06/2018   PFIZER(Purple Top)SARS-COV-2 Vaccination 12/07/2019, 12/29/2019   Pneumococcal Conjugate-13 12/26/2017   Pneumococcal Polysaccharide-23 06/22/2021   Zoster Recombinat (Shingrix) 05/14/2018, 06/17/2021      Flu Vaccine status: Up to date  Pneumococcal vaccine status: Up to date  Covid-19 vaccine status: Completed vaccines  Qualifies for Shingles Vaccine? Yes   Zostavax completed Yes   Shingrix Completed?: Yes  Screening Tests Health Maintenance  Topic Date Due   COVID-19 Vaccine (3 - Pfizer series) 07/24/2023 (Originally 02/23/2020)   Hepatitis C Screening  07/27/2023 (Originally 08/22/1969)   TETANUS/TDAP  07/23/2024 (Originally 08/22/1970)   MAMMOGRAM  09/28/2023   COLONOSCOPY (Pts 45-31yr Insurance coverage will need to be confirmed)  06/27/2029   Pneumonia Vaccine 71 Years old  Completed   INFLUENZA VACCINE  Completed   DEXA SCAN  Completed   Zoster Vaccines- Shingrix  Completed   HPV VACCINES  Aged Out    Health Maintenance  There are no preventive care reminders to display for this patient.   Colorectal cancer screening: Type of screening: Colonoscopy. Completed 06/28/19. Repeat every 10 years  Mammogram status: Completed 09/27/21. Repeat every year  Bone Density status: Completed 10/14/21. Results reflect: Bone density results:  OSTEOPOROSIS. Repeat every   years.  Lung Cancer Screening: (Low Dose CT Chest recommended if Age 71-80years, 30 pack-year currently smoking OR have quit w/in 15years.) does not qualify.    Additional Screening:  Hepatitis C Screening: does qualify; Completed Deferred  Vision Screening: Recommended annual ophthalmology exams for early detection of glaucoma and other disorders of the eye. Is the patient up to date with their annual eye exam?  Yes  Who is the provider or what is the name of the office in which the patient attends annual eye exams? Dr DBing PlumeIf pt is not established with a provider, would they like to be referred to a provider to establish care? No .   Dental Screening: Recommended annual dental exams for proper oral hygiene  Community Resource Referral / Chronic Care Management:   CRR required this visit?  No   CCM required this visit?  No      Plan:     I have personally reviewed and noted the following in the patient's chart:   Medical and social history Use of alcohol, tobacco or illicit drugs  Current medications  and supplements including opioid prescriptions. Patient is not currently taking opioid prescriptions. Functional ability and status Nutritional status Physical activity Advanced directives List of other physicians Hospitalizations, surgeries, and ER visits in previous 12 months Vitals Screenings to include cognitive, depression, and falls Referrals and appointments  In addition, I have reviewed and discussed with patient certain preventive protocols, quality metrics, and best practice recommendations. A written personalized care plan for preventive services as well as general preventive health recommendations were provided to patient.     [name], LPN   10/30/107   Nurse Notes: Patient due labs Hep-C Screening

## 2022-07-26 NOTE — Patient Instructions (Addendum)
Pamela Daugherty , Thank you for taking time to come for your Medicare Wellness Visit. I appreciate your ongoing commitment to your health goals. Please review the following plan we discussed and let me know if I can assist you in the future.   These are the goals we discussed:  Goals       Exercise 150 min/wk Moderate Activity      Stay Healthy (pt-stated)        This is a list of the screening recommended for you and due dates:  Health Maintenance  Topic Date Due   COVID-19 Vaccine (3 - Pfizer series) 07/24/2023*   Hepatitis C Screening: USPSTF Recommendation to screen - Ages 18-79 yo.  07/27/2023*   Tetanus Vaccine  07/23/2024*   Mammogram  09/28/2023   Colon Cancer Screening  06/27/2029   Pneumonia Vaccine  Completed   Flu Shot  Completed   DEXA scan (bone density measurement)  Completed   Zoster (Shingles) Vaccine  Completed   HPV Vaccine  Aged Out  *Topic was postponed. The date shown is not the original due date.    Advanced directives: Advance directive discussed with you today. Even though you declined this today, please call our office should you change your mind, and we can give you the proper paperwork for you to fill out.   Conditions/risks identified: None  Next appointment: Follow up in one year for your annual wellness visit     Preventive Care 65 Years and Older, Female Preventive care refers to lifestyle choices and visits with your health care provider that can promote health and wellness. What does preventive care include? A yearly physical exam. This is also called an annual well check. Dental exams once or twice a year. Routine eye exams. Ask your health care provider how often you should have your eyes checked. Personal lifestyle choices, including: Daily care of your teeth and gums. Regular physical activity. Eating a healthy diet. Avoiding tobacco and drug use. Limiting alcohol use. Practicing safe sex. Taking low-dose aspirin every day. Taking  vitamin and mineral supplements as recommended by your health care provider. What happens during an annual well check? The services and screenings done by your health care provider during your annual well check will depend on your age, overall health, lifestyle risk factors, and family history of disease. Counseling  Your health care provider may ask you questions about your: Alcohol use. Tobacco use. Drug use. Emotional well-being. Home and relationship well-being. Sexual activity. Eating habits. History of falls. Memory and ability to understand (cognition). Work and work Statistician. Reproductive health. Screening  You may have the following tests or measurements: Height, weight, and BMI. Blood pressure. Lipid and cholesterol levels. These may be checked every 5 years, or more frequently if you are over 50 years old. Skin check. Lung cancer screening. You may have this screening every year starting at age 36 if you have a 30-pack-year history of smoking and currently smoke or have quit within the past 15 years. Fecal occult blood test (FOBT) of the stool. You may have this test every year starting at age 62. Flexible sigmoidoscopy or colonoscopy. You may have a sigmoidoscopy every 5 years or a colonoscopy every 10 years starting at age 72. Hepatitis C blood test. Hepatitis B blood test. Sexually transmitted disease (STD) testing. Diabetes screening. This is done by checking your blood sugar (glucose) after you have not eaten for a while (fasting). You may have this done every 1-3 years. Bone density scan.  This is done to screen for osteoporosis. You may have this done starting at age 52. Mammogram. This may be done every 1-2 years. Talk to your health care provider about how often you should have regular mammograms. Talk with your health care provider about your test results, treatment options, and if necessary, the need for more tests. Vaccines  Your health care provider may  recommend certain vaccines, such as: Influenza vaccine. This is recommended every year. Tetanus, diphtheria, and acellular pertussis (Tdap, Td) vaccine. You may need a Td booster every 10 years. Zoster vaccine. You may need this after age 67. Pneumococcal 13-valent conjugate (PCV13) vaccine. One dose is recommended after age 41. Pneumococcal polysaccharide (PPSV23) vaccine. One dose is recommended after age 19. Talk to your health care provider about which screenings and vaccines you need and how often you need them. This information is not intended to replace advice given to you by your health care provider. Make sure you discuss any questions you have with your health care provider. Document Released: 11/06/2015 Document Revised: 06/29/2016 Document Reviewed: 08/11/2015 Elsevier Interactive Patient Education  2017 [place] Prevention in the Home Falls can cause injuries. They can happen to people of all ages. There are many things you can do to make your home safe and to help prevent falls. What can I do on the outside of my home? Regularly fix the edges of walkways and driveways and fix any cracks. Remove anything that might make you trip as you walk through a door, such as a raised step or threshold. Trim any bushes or trees on the path to your home. Use bright outdoor lighting. Clear any walking paths of anything that might make someone trip, such as rocks or tools. Regularly check to see if handrails are loose or broken. Make sure that both sides of any steps have handrails. Any raised decks and porches should have guardrails on the edges. Have any leaves, snow, or ice cleared regularly. Use sand or salt on walking paths during winter. Clean up any spills in your garage right away. This includes oil or grease spills. What can I do in the bathroom? Use night lights. Install grab bars by the toilet and in the tub and shower. Do not use towel bars as grab bars. Use non-skid  mats or decals in the tub or shower. If you need to sit down in the shower, use a plastic, non-slip stool. Keep the floor dry. Clean up any water that spills on the floor as soon as it happens. Remove soap buildup in the tub or shower regularly. Attach bath mats securely with double-sided non-slip rug tape. Do not have throw rugs and other things on the floor that can make you trip. What can I do in the bedroom? Use night lights. Make sure that you have a light by your bed that is easy to reach. Do not use any sheets or blankets that are too big for your bed. They should not hang down onto the floor. Have a firm chair that has side arms. You can use this for support while you get dressed. Do not have throw rugs and other things on the floor that can make you trip. What can I do in the kitchen? Clean up any spills right away. Avoid walking on wet floors. Keep items that you use a lot in easy-to-reach places. If you need to reach something above you, use a strong step stool that has a grab bar. Keep electrical cords  out of the way. Do not use floor polish or wax that makes floors slippery. If you must use wax, use non-skid floor wax. Do not have throw rugs and other things on the floor that can make you trip. What can I do with my stairs? Do not leave any items on the stairs. Make sure that there are handrails on both sides of the stairs and use them. Fix handrails that are broken or loose. Make sure that handrails are as long as the stairways. Check any carpeting to make sure that it is firmly attached to the stairs. Fix any carpet that is loose or worn. Avoid having throw rugs at the top or bottom of the stairs. If you do have throw rugs, attach them to the floor with carpet tape. Make sure that you have a light switch at the top of the stairs and the bottom of the stairs. If you do not have them, ask someone to add them for you. What else can I do to help prevent falls? Wear shoes  that: Do not have high heels. Have rubber bottoms. Are comfortable and fit you well. Are closed at the toe. Do not wear sandals. If you use a stepladder: Make sure that it is fully opened. Do not climb a closed stepladder. Make sure that both sides of the stepladder are locked into place. Ask someone to hold it for you, if possible. Clearly mark and make sure that you can see: Any grab bars or handrails. First and last steps. Where the edge of each step is. Use tools that help you move around (mobility aids) if they are needed. These include: Canes. Walkers. Scooters. Crutches. Turn on the lights when you go into a dark area. Replace any light bulbs as soon as they burn out. Set up your furniture so you have a clear path. Avoid moving your furniture around. If any of your floors are uneven, fix them. If there are any pets around you, be aware of where they are. Review your medicines with your doctor. Some medicines can make you feel dizzy. This can increase your chance of falling. Ask your doctor what other things that you can do to help prevent falls. This information is not intended to replace advice given to you by your health care provider. Make sure you discuss any questions you have with your health care provider. Document Released: 08/06/2009 Document Revised: 03/17/2016 Document Reviewed: 11/14/2014 Elsevier Interactive Patient Education  2017 [employer].

## 2022-09-10 ENCOUNTER — Encounter: Payer: Self-pay | Admitting: Family Medicine

## 2022-09-12 ENCOUNTER — Other Ambulatory Visit: Payer: Self-pay

## 2022-09-12 DIAGNOSIS — Z780 Asymptomatic menopausal state: Secondary | ICD-10-CM

## 2022-09-12 MED ORDER — ALENDRONATE SODIUM 10 MG PO TABS: 10.0000 mg | ORAL_TABLET | Freq: Every day | ORAL | 1 refills | Status: DC

## 2022-09-20 DIAGNOSIS — Z1231 Encounter for screening mammogram for malignant neoplasm of breast: Secondary | ICD-10-CM | POA: Diagnosis not present

## 2022-10-05 ENCOUNTER — Other Ambulatory Visit: Payer: Self-pay | Admitting: Family Medicine

## 2022-10-06 DIAGNOSIS — L218 Other seborrheic dermatitis: Secondary | ICD-10-CM | POA: Diagnosis not present

## 2022-10-06 DIAGNOSIS — D225 Melanocytic nevi of trunk: Secondary | ICD-10-CM | POA: Diagnosis not present

## 2022-10-06 DIAGNOSIS — L821 Other seborrheic keratosis: Secondary | ICD-10-CM | POA: Diagnosis not present

## 2022-10-06 DIAGNOSIS — L408 Other psoriasis: Secondary | ICD-10-CM | POA: Diagnosis not present

## 2022-10-06 DIAGNOSIS — L648 Other androgenic alopecia: Secondary | ICD-10-CM | POA: Diagnosis not present

## 2022-10-06 DIAGNOSIS — Z872 Personal history of diseases of the skin and subcutaneous tissue: Secondary | ICD-10-CM | POA: Diagnosis not present

## 2022-10-06 DIAGNOSIS — L814 Other melanin hyperpigmentation: Secondary | ICD-10-CM | POA: Diagnosis not present

## 2022-10-12 DIAGNOSIS — N3020 Other chronic cystitis without hematuria: Secondary | ICD-10-CM | POA: Diagnosis not present

## 2023-02-22 ENCOUNTER — Encounter: Payer: Self-pay | Admitting: Family Medicine

## 2023-02-23 ENCOUNTER — Other Ambulatory Visit: Payer: Self-pay

## 2023-02-23 DIAGNOSIS — Z780 Asymptomatic menopausal state: Secondary | ICD-10-CM

## 2023-02-23 MED ORDER — ALENDRONATE SODIUM 10 MG PO TABS: 10.0000 mg | ORAL_TABLET | Freq: Every day | ORAL | 1 refills | Status: DC

## 2023-03-02 ENCOUNTER — Encounter: Payer: Self-pay | Admitting: Family Medicine

## 2023-03-03 ENCOUNTER — Other Ambulatory Visit: Payer: Self-pay

## 2023-03-06 ENCOUNTER — Other Ambulatory Visit: Payer: Self-pay

## 2023-03-06 MED ORDER — FLUTICASONE PROPIONATE 50 MCG/ACT NA SUSP: 1.0000 | Freq: Every day | NASAL | 2 refills | Status: DC

## 2023-03-06 NOTE — Telephone Encounter (Signed)
Rx for Flonase was sent to pt pharmacy, pt is requesting for a new Rx for Allegra and needs it sent to [facility] by mail. Please advise

## 2023-03-07 MED ORDER — FEXOFENADINE HCL 180 MG PO TABS: 180.0000 mg | ORAL_TABLET | Freq: Every day | ORAL | 3 refills | Status: DC

## 2023-03-07 NOTE — Telephone Encounter (Signed)
Done

## 2023-04-03 DIAGNOSIS — H9123 Sudden idiopathic hearing loss, bilateral: Secondary | ICD-10-CM | POA: Diagnosis not present

## 2023-04-03 DIAGNOSIS — H903 Sensorineural hearing loss, bilateral: Secondary | ICD-10-CM | POA: Diagnosis not present

## 2023-04-03 DIAGNOSIS — H838X3 Other specified diseases of inner ear, bilateral: Secondary | ICD-10-CM | POA: Diagnosis not present

## 2023-04-10 ENCOUNTER — Encounter: Payer: Self-pay | Admitting: Family Medicine

## 2023-04-10 ENCOUNTER — Ambulatory Visit (INDEPENDENT_AMBULATORY_CARE_PROVIDER_SITE_OTHER): Payer: Medicare Other | Admitting: Family Medicine

## 2023-04-10 VITALS — BP 108/78 | HR 75 | Temp 98.1°F | Wt 174.4 lb

## 2023-04-10 DIAGNOSIS — I10 Essential (primary) hypertension: Secondary | ICD-10-CM | POA: Diagnosis not present

## 2023-04-10 DIAGNOSIS — F418 Other specified anxiety disorders: Secondary | ICD-10-CM

## 2023-04-10 MED ORDER — ESCITALOPRAM OXALATE 20 MG PO TABS: 20.0000 mg | ORAL_TABLET | Freq: Every day | ORAL | 3 refills | Status: DC

## 2023-04-10 MED ORDER — OLMESARTAN MEDOXOMIL-HCTZ 20-12.5 MG PO TABS: 1.0000 | ORAL_TABLET | Freq: Every day | ORAL | 3 refills | Status: DC

## 2023-04-10 NOTE — Progress Notes (Signed)
   Subjective:    Patient ID: Pamela Daugherty, female    DOB: 1951-10-07, 72 y.o.   MRN: [id number]  HPI Here to follow up on HTN. She had been taking Triamterene hydrochlorothiazide but she noticed her BP was creeping up. She was getting readings of 1140's over 90's at home. Then last week she saw Dr. Iona Hansen at [facility] for sinus congestion, and her BP that day was 154/94. He put her on a course of Prednisone 20 mg daily, but this made her feel horrible so she stopped it after 4 days. She stopped the Triamterene hydrochlorothiazide, and she went back on Benicar HCT 20-12.5 daily that she still had at home. Since then she has felt back to normal, and her BP has been stable.    Review of Systems  Constitutional: Negative.   Respiratory: Negative.    Cardiovascular: Negative.        Objective:   Physical Exam Constitutional:      Appearance: Normal appearance.  Cardiovascular:     Rate and Rhythm: Normal rate and regular rhythm.     Pulses: Normal pulses.     Heart sounds: Normal heart sounds.  Pulmonary:     Effort: Pulmonary effort is normal.     Breath sounds: Normal breath sounds.  Neurological:     Mental Status: She is alert.           Assessment & Plan:  HTN, now well controlled. We will keep her on the Benicar HCT. She will be back in September for a well exam.  Gershon Crane, MD

## 2023-05-04 ENCOUNTER — Other Ambulatory Visit: Payer: Self-pay | Admitting: Family Medicine

## 2023-05-05 NOTE — Telephone Encounter (Signed)
  The original prescription was discontinued on 04/10/2023 by Carola Rhine, CMA for the following reason: No longer needed (for PRN medications). Renewing this prescription may not be appropriate.

## 2023-05-26 DIAGNOSIS — M25561 Pain in right knee: Secondary | ICD-10-CM | POA: Diagnosis not present

## 2023-06-07 DIAGNOSIS — S82001A Unspecified fracture of right patella, initial encounter for closed fracture: Secondary | ICD-10-CM | POA: Diagnosis not present

## 2023-06-08 ENCOUNTER — Encounter (INDEPENDENT_AMBULATORY_CARE_PROVIDER_SITE_OTHER): Payer: Self-pay

## 2023-06-09 ENCOUNTER — Encounter: Payer: Medicare Other | Admitting: Family Medicine

## 2023-06-09 DIAGNOSIS — M25561 Pain in right knee: Secondary | ICD-10-CM | POA: Diagnosis not present

## 2023-07-03 ENCOUNTER — Encounter: Payer: Self-pay | Admitting: Family Medicine

## 2023-07-07 DIAGNOSIS — M25561 Pain in right knee: Secondary | ICD-10-CM | POA: Diagnosis not present

## 2023-07-10 ENCOUNTER — Encounter: Payer: Self-pay | Admitting: Family Medicine

## 2023-07-10 ENCOUNTER — Ambulatory Visit (INDEPENDENT_AMBULATORY_CARE_PROVIDER_SITE_OTHER): Payer: Medicare Other | Admitting: Family Medicine

## 2023-07-10 VITALS — BP 106/70 | HR 70 | Temp 97.7°F | Ht 67.5 in | Wt 170.0 lb

## 2023-07-10 DIAGNOSIS — F418 Other specified anxiety disorders: Secondary | ICD-10-CM

## 2023-07-10 DIAGNOSIS — K581 Irritable bowel syndrome with constipation: Secondary | ICD-10-CM

## 2023-07-10 DIAGNOSIS — D509 Iron deficiency anemia, unspecified: Secondary | ICD-10-CM

## 2023-07-10 DIAGNOSIS — Z23 Encounter for immunization: Secondary | ICD-10-CM

## 2023-07-10 DIAGNOSIS — K5901 Slow transit constipation: Secondary | ICD-10-CM | POA: Diagnosis not present

## 2023-07-10 DIAGNOSIS — K219 Gastro-esophageal reflux disease without esophagitis: Secondary | ICD-10-CM

## 2023-07-10 DIAGNOSIS — I10 Essential (primary) hypertension: Secondary | ICD-10-CM | POA: Diagnosis not present

## 2023-07-10 DIAGNOSIS — R739 Hyperglycemia, unspecified: Secondary | ICD-10-CM

## 2023-07-10 LAB — LIPID PANEL
Cholesterol: 193 mg/dL (ref 0–200)
HDL: 53.7 mg/dL (ref >39.00)
LDL Cholesterol: 103 mg/dL — ABNORMAL HIGH (ref 0–99)
NonHDL: 139.06
Total CHOL/HDL Ratio: 4
Triglycerides: 180 mg/dL — ABNORMAL HIGH (ref 0.0–149.0)
VLDL: 36 mg/dL (ref 0.0–40.0)

## 2023-07-10 LAB — BASIC METABOLIC PANEL
BUN: 19 mg/dL (ref 6–23)
CO2: 28 meq/L (ref 19–32)
Calcium: 9.7 mg/dL (ref 8.4–10.5)
Chloride: 98 meq/L (ref 96–112)
Creatinine, Ser: 0.88 mg/dL (ref 0.40–1.20)
GFR: 65.91 mL/min (ref >60.00)
Glucose, Bld: 84 mg/dL (ref 70–99)
Potassium: 3.8 meq/L (ref 3.5–5.1)
Sodium: 136 mEq/L (ref 135–145)

## 2023-07-10 LAB — CBC WITH DIFFERENTIAL/PLATELET
Basophils Absolute: 0 10*3/uL (ref 0.0–0.1)
Basophils Relative: 0.6 % (ref 0.0–3.0)
Eosinophils Absolute: 0.2 10*3/uL (ref 0.0–0.7)
Eosinophils Relative: 3.7 % (ref 0.0–5.0)
HCT: 40.5 % (ref 36.0–46.0)
Hemoglobin: 13.6 g/dL (ref 12.0–15.0)
Lymphocytes Relative: 42.1 % (ref 12.0–46.0)
Lymphs Abs: 2.5 10*3/uL (ref 0.7–4.0)
MCHC: 33.4 g/dL (ref 30.0–36.0)
MCV: 88.5 fl (ref 78.0–100.0)
Monocytes Absolute: 0.4 10*3/uL (ref 0.1–1.0)
Monocytes Relative: 7.3 % (ref 3.0–12.0)
Neutro Abs: 2.8 10*3/uL (ref 1.4–7.7)
Neutrophils Relative %: 46.3 % (ref 43.0–77.0)
Platelets: 235 10*3/uL (ref 150.0–400.0)
RBC: 4.58 Mil/uL (ref 3.87–5.11)
RDW: 13.2 % (ref 11.5–15.5)
WBC: 6.1 10*3/uL (ref 4.0–10.5)

## 2023-07-10 LAB — HEPATIC FUNCTION PANEL
ALT: 17 U/L (ref 0–35)
AST: 24 U/L (ref 0–37)
Albumin: 4.3 g/dL (ref 3.5–5.2)
Alkaline Phosphatase: 46 U/L (ref 39–117)
Bilirubin, Direct: 0.1 mg/dL (ref 0.0–0.3)
Total Bilirubin: 0.6 mg/dL (ref 0.2–1.2)
Total Protein: 7 g/dL (ref 6.0–8.3)

## 2023-07-10 LAB — IBC + FERRITIN
Ferritin: 60.3 ng/mL (ref 10.0–291.0)
Iron: 101 ug/dL (ref 42–145)
Saturation Ratios: 29 % (ref 20.0–50.0)
TIBC: 348.6 ug/dL (ref 250.0–450.0)
Transferrin: 249 mg/dL (ref 212.0–360.0)

## 2023-07-10 LAB — TSH: TSH: 2.94 u[IU]/mL (ref 0.35–5.50)

## 2023-07-10 LAB — HEMOGLOBIN A1C: Hgb A1c MFr Bld: 5.7 % (ref 4.6–6.5)

## 2023-07-10 MED ORDER — ALPRAZOLAM 0.5 MG PO TABS: 0.5000 mg | ORAL_TABLET | Freq: Every evening | ORAL | 1 refills | Status: DC | PRN

## 2023-07-10 MED ORDER — ESOMEPRAZOLE MAGNESIUM 20 MG PO CPDR: 20.0000 mg | DELAYED_RELEASE_CAPSULE | Freq: Every day | ORAL | 3 refills | Status: DC

## 2023-07-10 MED ORDER — OLMESARTAN MEDOXOMIL 20 MG PO TABS: 20.0000 mg | ORAL_TABLET | Freq: Every day | ORAL | 3 refills | Status: DC

## 2023-07-10 NOTE — Progress Notes (Signed)
Subjective:    Patient ID: Pamela Daugherty, female    DOB: [birth year], 72 y.o.   MRN: [id number]  HPI Here to follow up on issues. She feels well except some occasional brief spells of lightheadedness when she stands up. She has not checked her BP for some time. She has not had any vertigo for a long time now. Her GERD is stable, and her BM's are regular. Her moods are stable.    Review of Systems  Constitutional: Negative.   HENT: Negative.    Eyes: Negative.   Respiratory: Negative.    Cardiovascular: Negative.   Gastrointestinal: Negative.   Genitourinary:  Negative for decreased urine volume, difficulty urinating, dyspareunia, dysuria, enuresis, flank pain, frequency, hematuria, pelvic pain and urgency.  Musculoskeletal: Negative.   Skin: Negative.   Neurological:  Positive for light-headedness. Negative for dizziness and headaches.  Psychiatric/Behavioral: Negative.         Objective:   Physical Exam Constitutional:      General: She is not in acute distress.    Appearance: Normal appearance. She is well-developed.  HENT:     Head: Normocephalic and atraumatic.     Right Ear: External ear normal.     Left Ear: External ear normal.     Nose: Nose normal.     Mouth/Throat:     Pharynx: No oropharyngeal exudate.  Eyes:     General: No scleral icterus.    Conjunctiva/sclera: Conjunctivae normal.     Pupils: Pupils are equal, round, and reactive to light.  Neck:     Thyroid: No thyromegaly.     Vascular: No JVD.  Cardiovascular:     Rate and Rhythm: Normal rate and regular rhythm.     Pulses: Normal pulses.     Heart sounds: Normal heart sounds. No murmur heard.    No friction rub. No gallop.  Pulmonary:     Effort: Pulmonary effort is normal. No respiratory distress.     Breath sounds: Normal breath sounds. No wheezing or rales.  Chest:     Chest wall: No tenderness.  Abdominal:     General: Bowel sounds are normal. There is no distension.     Palpations:  Abdomen is soft. There is no mass.     Tenderness: There is no abdominal tenderness. There is no guarding or rebound.  Musculoskeletal:        General: No tenderness. Normal range of motion.     Cervical back: Normal range of motion and neck supple.  Lymphadenopathy:     Cervical: No cervical adenopathy.  Skin:    General: Skin is warm and dry.     Findings: No erythema or rash.  Neurological:     General: No focal deficit present.     Mental Status: She is alert and oriented to person, place, and time.     Cranial Nerves: No cranial nerve deficit.     Motor: No abnormal muscle tone.     Coordination: Coordination normal.     Deep Tendon Reflexes: Reflexes are normal and symmetric. Reflexes normal.  Psychiatric:        Mood and Affect: Mood normal.        Behavior: Behavior normal.        Thought Content: Thought content normal.        Judgment: Judgment normal.           Assessment & Plan:  Her HTN is a little over-controlled, so we will stop the hydrochlorothiazide  and she will take Olmesartan 20 mg daily by itself. Her IBS and GERD are stable. Her depression and anxiety are stable. Get fasting labs to check lipids, Hgb and iron, etc. We spent a total of ( 33  ) minutes reviewing records and discussing these issues.  Gershon Crane, MD

## 2023-07-10 NOTE — Addendum Note (Signed)
Addended by: Carola Rhine on: 07/10/2023 02:09 PM   Modules accepted: Orders

## 2023-07-12 ENCOUNTER — Encounter: Payer: Self-pay | Admitting: Family Medicine

## 2023-07-12 DIAGNOSIS — Z780 Asymptomatic menopausal state: Secondary | ICD-10-CM

## 2023-07-12 MED ORDER — ALENDRONATE SODIUM 10 MG PO TABS: 10.0000 mg | ORAL_TABLET | Freq: Every day | ORAL | 3 refills | Status: DC

## 2023-07-12 NOTE — Telephone Encounter (Signed)
Yes, and I sent in the refills

## 2023-07-31 ENCOUNTER — Ambulatory Visit (INDEPENDENT_AMBULATORY_CARE_PROVIDER_SITE_OTHER): Payer: Medicare Other

## 2023-07-31 VITALS — Ht 67.5 in | Wt 170.0 lb

## 2023-07-31 DIAGNOSIS — Z0000 Encounter for general adult medical examination without abnormal findings: Secondary | ICD-10-CM

## 2023-07-31 NOTE — Patient Instructions (Addendum)
Ms. Belford , Thank you for taking time to come for your Medicare Wellness Visit. I appreciate your ongoing commitment to your health goals. Please review the following plan we discussed and let me know if I can assist you in the future.   Referrals/Orders/Follow-Ups/Clinician Recommendations:   This is a list of the screening recommended for you and due dates:  Health Maintenance  Topic Date Due   Hepatitis C Screening  Never done   DTaP/Tdap/Td vaccine (1 - Tdap) Never done   COVID-19 Vaccine (3 - 2023-24 season) 06/25/2023   Mammogram  09/28/2023   Medicare Annual Wellness Visit  07/30/2024   Colon Cancer Screening  06/27/2029   Pneumonia Vaccine  Completed   Flu Shot  Completed   DEXA scan (bone density measurement)  Completed   Zoster (Shingles) Vaccine  Completed   HPV Vaccine  Aged Out    Advanced directives: (Copy Requested) Please bring a copy of your health care power of attorney and living will to the office to be added to your chart at your convenience.  Next Medicare Annual Wellness Visit scheduled for next year: Yes

## 2023-07-31 NOTE — Progress Notes (Signed)
Subjective:   Pamela Daugherty is a 72 y.o. female who presents for Medicare Annual (Subsequent) preventive examination.  Visit Complete: Virtual  I connected with  Pamela Daugherty on 07/31/23 by a audio enabled telemedicine application and verified that I am speaking with the correct person using two identifiers.  Patient Location: Home  Provider Location: Home Office  I discussed the limitations of evaluation and management by telemedicine. The patient expressed understanding and agreed to proceed.  Patient Medicare AWV questionnaire was completed by the patient on 07/27/23; I have confirmed that all information answered by patient is correct and no changes since this date.  Cardiac Risk Factors include: advanced age (>14men, >3 women);hypertension  Because this visit was a virtual/telehealth visit, some criteria may be missing or patient reported. Any vitals not documented were not able to be obtained and vitals that have been documented are patient reported.       Objective:    Today's Vitals   07/31/23 1055  Weight: 170 lb (77.1 kg)  Height: 5' 7.5" (1.715 m)   Body mass index is 26.23 kg/m.     07/31/2023   11:06 AM 07/26/2022   10:29 AM 11/11/2021    1:50 PM 07/16/2021   10:40 AM 07/10/2020   10:44 AM  Advanced Directives  Does Patient Have a Medical Advance Directive? Yes No No No No  Type of Estate agent of [place];Living will      Copy of Healthcare Power of Attorney in Chart? No - copy requested      Would patient like information on creating a medical advance directive?  No - Patient declined No - Patient declined No - Patient declined No - Patient declined    Current Medications (verified) Outpatient Encounter Medications as of 07/31/2023  Medication Sig   alendronate (FOSAMAX) 10 MG tablet Take 1 tablet (10 mg total) by mouth daily before breakfast. Take with a full glass of water on an empty stomach.   ALPRAZolam (XANAX) 0.5 MG tablet Take  1 tablet (0.5 mg total) by mouth at bedtime as needed for sleep.   aspirin 81 MG EC tablet Take 81 mg by mouth daily.   Clobetasol Propionate Emulsion 0.05 % topical foam Apply 1 application topically 2 (two) times daily.   Cranberry 250 MG TABS Take 250 mg by mouth in the morning and at bedtime.   D-Mannose 500 MG CAPS Take 500 mg by mouth daily.   escitalopram (LEXAPRO) 20 MG tablet Take 1 tablet (20 mg total) by mouth daily.   esomeprazole (NEXIUM) 20 MG capsule Take 1 capsule (20 mg total) by mouth daily at 12 noon.   estradiol (ESTRACE) 0.1 MG/GM vaginal cream SMARTSIG:Gram(s) Vaginal Daily   fish oil-omega-3 fatty acids 1000 MG capsule Take 1 g by mouth daily.   fluticasone (FLONASE) 50 MCG/ACT nasal spray Place 1 spray into both nostrils daily.   ketoconazole (NIZORAL) 2 % cream    meclizine (ANTIVERT) 25 MG tablet Take 1 tablet (25 mg total) by mouth every 4 (four) hours as needed for dizziness.   olmesartan (BENICAR) 20 MG tablet Take 1 tablet (20 mg total) by mouth daily.   valACYclovir (VALTREX) 500 MG tablet Take 1,000 mg by mouth 2 (two) times daily.   vitamin B-12 (CYANOCOBALAMIN) 250 MCG tablet Take 250 mcg by mouth daily.   vitamin E 600 UNIT capsule Take 600 Units by mouth daily.   No facility-administered encounter medications on file as of 07/31/2023.  Allergies (verified) Penicillins   History: Past Medical History:  Diagnosis Date   Anemia    Anxiety    B12 deficiency    Depression    Esophageal reflux    Esophageal stricture    Fatty liver    Hypertension    IBS (irritable bowel syndrome)    Insomnia    Meniere disease, bilateral    sees Dr. Julianne Rice with [facility]   Neck pain    Routine gynecological examination    sees Dr. Ilda Mori    Past Surgical History:  Procedure Laterality Date   APPENDECTOMY     COLONOSCOPY  06/28/2019   per Dr. Adela Lank, benign polyps, repeat in 10 yrs    ESOPHAGOGASTRODUODENOSCOPY  06/28/2019   per  Dr. Adela Lank, benign gastric polyp, source of GI blood loss    TONSILLECTOMY     TUBAL LIGATION     Family History  Problem Relation Age of Onset   Coronary artery disease Mother    Diabetes Mother    Irritable bowel syndrome Mother    Heart failure Mother    Healthy Father    Colon cancer Neg Hx    Esophageal cancer Neg Hx    Rectal cancer Neg Hx    Stomach cancer Neg Hx    Social History   Socioeconomic History   Marital status: Widowed    Spouse name: Not on file   Number of children: Not on file   Years of education: Not on file   Highest education level: 12th grade  Occupational History   Occupation: adjuster  Tobacco Use   Smoking status: Never   Smokeless tobacco: Never  Substance and Sexual Activity   Alcohol use: Yes    Alcohol/week: 2.0 standard drinks of alcohol    Types: 2 Standard drinks or equivalent per week   Drug use: No   Sexual activity: Not Currently  Other Topics Concern   Not on file  Social History Narrative   Not on file   Social Determinants of Health   Financial Resource Strain: Low Risk  (07/27/2023)   Overall Financial Resource Strain (CARDIA)    Difficulty of Paying Living Expenses: Not hard at all  Food Insecurity: No Food Insecurity (07/27/2023)   Hunger Vital Sign    Worried About Running Out of Food in the Last Year: Never true    Ran Out of Food in the Last Year: Never true  Transportation Needs: No Transportation Needs (07/27/2023)   PRAPARE - Administrator, Civil Service (Medical): No    Lack of Transportation (Non-Medical): No  Physical Activity: Insufficiently Active (07/27/2023)   Exercise Vital Sign    Days of Exercise per Week: 2 days    Minutes of Exercise per Session: 10 min  Stress: Stress Concern Present (07/27/2023)   [employer] of Occupational Health - Occupational Stress Questionnaire    Feeling of Stress : To some extent  Social Connections: Moderately Integrated (07/27/2023)   Social  Connection and Isolation Panel [NHANES]    Frequency of Communication with Friends and Family: More than three times a week    Frequency of Social Gatherings with Friends and Family: Once a week    Attends Religious Services: More than 4 times per year    Active Member of [employer] or Organizations: Yes    Attends Banker Meetings: More than 4 times per year    Marital Status: Widowed    Tobacco Counseling Counseling given:  Not Answered   Clinical Intake:  Pre-visit preparation completed: Yes  Pain : No/denies pain     BMI - recorded: 26.23 Nutritional Status: BMI 25 -29 Overweight Nutritional Risks: None Diabetes: No  How often do you need to have someone help you when you read instructions, pamphlets, or other written materials from your doctor or pharmacy?: 1 - Never  Interpreter Needed?: No  Information entered by :: Theresa Mulligan LPN   Activities of Daily Living    07/27/2023    1:10 PM  In your present state of health, do you have any difficulty performing the following activities:  Hearing? 1  Comment Pending f/u by audiologist  Vision? 0  Difficulty concentrating or making decisions? 0  Walking or climbing stairs? 0  Dressing or bathing? 0  Doing errands, shopping? 0  Preparing Food and eating ? N  Using the Toilet? N  In the past six months, have you accidently leaked urine? N  Do you have problems with loss of bowel control? N  Managing your Medications? N  Managing your Finances? N  Housekeeping or managing your Housekeeping? N    Patient Care Team: Nelwyn Salisbury, MD as PCP - General Liliane Shi Dorian Furnace, MD as Consulting Physician (Urology)  Indicate any recent Medical Services you may have received from other than [facility] providers in the past year (date may be approximate).     Assessment:   This is a routine wellness examination for Jayma.  Hearing/Vision screen Hearing Screening - Comments:: Patient has  hearing  difficulties. Pending Audiologist follow up.   Vision Screening - Comments:: Wears rx glasses - up to date with routine eye exams with  Dr Hazle Quant   Goals Addressed               This Visit's Progress     Siay active (pt-stated)         Depression Screen    07/31/2023   11:00 AM 07/10/2023    9:34 AM 07/26/2022   10:26 AM 07/08/2022   10:06 AM 10/01/2021   10:22 AM 07/16/2021   10:42 AM 07/16/2021   10:39 AM  PHQ 2/9 Scores  PHQ - 2 Score 0 2 0 0 2 0 0  PHQ- 9 Score 0 5 0 0 6      Fall Risk    07/31/2023   11:04 AM 07/27/2023    1:10 PM 07/10/2023    9:34 AM 04/09/2023   11:00 AM 07/26/2022   10:28 AM  Fall Risk   Falls in the past year? 1 1 1  0 1  Number falls in past yr: 0 1 1  0  Injury with Fall? 1  1  0  Comment Fx Patella. Followed by medical attention      Risk for fall due to :   No Fall Risks  No Fall Risks  Follow up Falls prevention discussed  Falls evaluation completed  Falls prevention discussed    MEDICARE RISK AT HOME: Medicare Risk at Home Any stairs in or around the home?: No If so, are there any without handrails?: No Home free of loose throw rugs in walkways, pet beds, electrical cords, etc?: Yes Adequate lighting in your home to reduce risk of falls?: Yes Life alert?: No Use of a cane, walker or w/c?: No Grab bars in the bathroom?: Yes Shower chair or bench in shower?: Yes Elevated toilet seat or a handicapped toilet?: Yes  TIMED UP AND GO:  Was the test  performed?  No    Cognitive Function:        07/31/2023   11:06 AM 07/26/2022   10:30 AM  6CIT Screen  What Year? 0 points 0 points  What month? 0 points 0 points  What time? 0 points 0 points  Count back from 20 0 points 2 points  Months in reverse 0 points 0 points  Repeat phrase 0 points 0 points  Total Score 0 points 2 points    Immunizations Immunization History  Administered Date(s) Administered   Fluad Quad(high Dose 65+) 08/31/2019, 08/31/2020, 07/08/2022   Fluad  Trivalent(High Dose 65+) 07/10/2023   Influenza, High Dose Seasonal PF 11/29/2016, 12/26/2017, 11/06/2018   Influenza,inj,Quad PF,6+ Mos 10/11/2013, 10/13/2014, 10/21/2015, 08/16/2021   Influenza-Unspecified 11/06/2018   PFIZER(Purple Top)SARS-COV-2 Vaccination 12/07/2019, 12/29/2019   Pneumococcal Conjugate-13 12/26/2017   Pneumococcal Polysaccharide-23 06/22/2021   Zoster Recombinant(Shingrix) 05/14/2018, 06/17/2021    TDAP status: Due, Education has been provided regarding the importance of this vaccine. Advised may receive this vaccine at local pharmacy or Health Dept. Aware to provide a copy of the vaccination record if obtained from local pharmacy or Health Dept. Verbalized acceptance and understanding.  Flu Vaccine status: Up to date  Pneumococcal vaccine status: Up to date  Covid-19 vaccine status: Declined, Education has been provided regarding the importance of this vaccine but patient still declined. Advised may receive this vaccine at local pharmacy or Health Dept.or vaccine clinic. Aware to provide a copy of the vaccination record if obtained from local pharmacy or Health Dept. Verbalized acceptance and understanding.  Qualifies for Shingles Vaccine? Yes   Zostavax completed Yes   Shingrix Completed?: Yes  Screening Tests Health Maintenance  Topic Date Due   Hepatitis C Screening  Never done   DTaP/Tdap/Td (1 - Tdap) Never done   COVID-19 Vaccine (3 - 2023-24 season) 06/25/2023   MAMMOGRAM  09/28/2023   Medicare Annual Wellness (AWV)  07/30/2024   Colonoscopy  06/27/2029   Pneumonia Vaccine 36+ Years old  Completed   INFLUENZA VACCINE  Completed   DEXA SCAN  Completed   Zoster Vaccines- Shingrix  Completed   HPV VACCINES  Aged Out    Health Maintenance  Health Maintenance Due  Topic Date Due   Hepatitis C Screening  Never done   DTaP/Tdap/Td (1 - Tdap) Never done   COVID-19 Vaccine (3 - 2023-24 season) 06/25/2023    Colorectal cancer screening: Type of  screening: Colonoscopy. Completed 06/28/19. Repeat every 10 years  Mammogram status: Completed 09/27/21. Repeat every year  Bone Density status: Completed 10/14/21. Results reflect: Bone density results: OSTEOPOROSIS. Repeat every   years.    Additional Screening:  Hepatitis C Screening: does qualify; Deferred  Vision Screening: Recommended annual ophthalmology exams for early detection of glaucoma and other disorders of the eye. Is the patient up to date with their annual eye exam?  Yes  Who is the provider or what is the name of the office in which the patient attends annual eye exams? Dr Hazle Quant If pt is not established with a provider, would they like to be referred to a provider to establish care? No .   Dental Screening: Recommended annual dental exams for proper oral hygiene    Community Resource Referral / Chronic Care Management:  CRR required this visit?  No   CCM required this visit?  No     Plan:     I have personally reviewed and noted the following in the patient's chart:   Medical  and social history Use of alcohol, tobacco or illicit drugs  Current medications and supplements including opioid prescriptions. Patient is not currently taking opioid prescriptions. Functional ability and status Nutritional status Physical activity Advanced directives List of other physicians Hospitalizations, surgeries, and ER visits in previous 12 months Vitals Screenings to include cognitive, depression, and falls Referrals and appointments  In addition, I have reviewed and discussed with patient certain preventive protocols, quality metrics, and best practice recommendations. A written personalized care plan for preventive services as well as general preventive health recommendations were provided to patient.     Tillie Rung, LPN   [id number]/06/[id number]   After Visit Summary: (MyChart) Due to this being a telephonic visit, the after visit summary with patients personalized plan  was offered to patient via MyChart   Nurse Notes: None

## 2023-08-14 ENCOUNTER — Encounter: Payer: Self-pay | Admitting: Family Medicine

## 2023-08-14 MED ORDER — FLUTICASONE PROPIONATE 50 MCG/ACT NA SUSP: 1.0000 | Freq: Every day | NASAL | 2 refills | Status: DC

## 2023-09-26 DIAGNOSIS — Z1231 Encounter for screening mammogram for malignant neoplasm of breast: Secondary | ICD-10-CM | POA: Diagnosis not present

## 2023-09-26 LAB — HM MAMMOGRAPHY

## 2023-10-12 DIAGNOSIS — D225 Melanocytic nevi of trunk: Secondary | ICD-10-CM | POA: Diagnosis not present

## 2023-10-12 DIAGNOSIS — L814 Other melanin hyperpigmentation: Secondary | ICD-10-CM | POA: Diagnosis not present

## 2023-10-12 DIAGNOSIS — B001 Herpesviral vesicular dermatitis: Secondary | ICD-10-CM | POA: Diagnosis not present

## 2023-10-12 DIAGNOSIS — Z872 Personal history of diseases of the skin and subcutaneous tissue: Secondary | ICD-10-CM | POA: Diagnosis not present

## 2023-10-12 DIAGNOSIS — D2222 Melanocytic nevi of left ear and external auricular canal: Secondary | ICD-10-CM | POA: Diagnosis not present

## 2023-10-12 DIAGNOSIS — L821 Other seborrheic keratosis: Secondary | ICD-10-CM | POA: Diagnosis not present

## 2023-10-20 ENCOUNTER — Encounter: Payer: Self-pay | Admitting: Family Medicine

## 2023-10-20 NOTE — Telephone Encounter (Signed)
Attempted to reach pt. Left a detail message to call us back and will send mychart message.

## 2023-11-10 DIAGNOSIS — N3020 Other chronic cystitis without hematuria: Secondary | ICD-10-CM | POA: Diagnosis not present

## 2023-11-10 DIAGNOSIS — R311 Benign essential microscopic hematuria: Secondary | ICD-10-CM | POA: Diagnosis not present

## 2023-11-17 DIAGNOSIS — K5730 Diverticulosis of large intestine without perforation or abscess without bleeding: Secondary | ICD-10-CM | POA: Diagnosis not present

## 2023-11-17 DIAGNOSIS — K8020 Calculus of gallbladder without cholecystitis without obstruction: Secondary | ICD-10-CM | POA: Diagnosis not present

## 2023-11-17 DIAGNOSIS — K449 Diaphragmatic hernia without obstruction or gangrene: Secondary | ICD-10-CM | POA: Diagnosis not present

## 2023-11-17 DIAGNOSIS — R311 Benign essential microscopic hematuria: Secondary | ICD-10-CM | POA: Diagnosis not present

## 2023-11-17 DIAGNOSIS — N3020 Other chronic cystitis without hematuria: Secondary | ICD-10-CM | POA: Diagnosis not present

## 2023-11-17 DIAGNOSIS — N201 Calculus of ureter: Secondary | ICD-10-CM | POA: Diagnosis not present

## 2023-11-17 DIAGNOSIS — K5090 Crohn's disease, unspecified, without complications: Secondary | ICD-10-CM | POA: Diagnosis not present

## 2024-02-16 ENCOUNTER — Ambulatory Visit: Payer: Medicare Other | Admitting: Gastroenterology

## 2024-03-21 ENCOUNTER — Telehealth: Payer: Self-pay

## 2024-03-21 NOTE — Telephone Encounter (Signed)
 PCP to advise.  Copied from CRM [phone] [id number]. Topic: Clinical - Medical Advice >> Mar 21, 2024  3:10 PM Pamela Daugherty wrote: Pt would like to know if she needs to fast for her Physical in September as the confirmation message she received states she does not but she usually does every year. Please give pt a call to verify if she needs to fast or not.

## 2024-03-22 NOTE — Telephone Encounter (Signed)
 Left pt detailed message for pt advised to fast for her CPE in September

## 2024-03-28 ENCOUNTER — Other Ambulatory Visit

## 2024-03-28 ENCOUNTER — Encounter: Payer: Self-pay | Admitting: Gastroenterology

## 2024-03-28 ENCOUNTER — Ambulatory Visit (INDEPENDENT_AMBULATORY_CARE_PROVIDER_SITE_OTHER): Admitting: Gastroenterology

## 2024-03-28 VITALS — BP 110/70 | HR 80 | Ht 68.0 in | Wt 178.1 lb

## 2024-03-28 DIAGNOSIS — K219 Gastro-esophageal reflux disease without esophagitis: Secondary | ICD-10-CM | POA: Diagnosis not present

## 2024-03-28 DIAGNOSIS — R198 Other specified symptoms and signs involving the digestive system and abdomen: Secondary | ICD-10-CM

## 2024-03-28 DIAGNOSIS — R152 Fecal urgency: Secondary | ICD-10-CM | POA: Diagnosis not present

## 2024-03-28 DIAGNOSIS — R1030 Lower abdominal pain, unspecified: Secondary | ICD-10-CM | POA: Diagnosis not present

## 2024-03-28 DIAGNOSIS — K31A19 Gastric intestinal metaplasia without dysplasia, unspecified site: Secondary | ICD-10-CM

## 2024-03-28 DIAGNOSIS — R194 Change in bowel habit: Secondary | ICD-10-CM

## 2024-03-28 DIAGNOSIS — R935 Abnormal findings on diagnostic imaging of other abdominal regions, including retroperitoneum: Secondary | ICD-10-CM | POA: Diagnosis not present

## 2024-03-28 DIAGNOSIS — I10 Essential (primary) hypertension: Secondary | ICD-10-CM

## 2024-03-28 DIAGNOSIS — K529 Noninfective gastroenteritis and colitis, unspecified: Secondary | ICD-10-CM

## 2024-03-28 LAB — CBC WITH DIFFERENTIAL/PLATELET
Basophils Absolute: 0 10*3/uL (ref 0.0–0.1)
Basophils Relative: 0.5 % (ref 0.0–3.0)
Eosinophils Absolute: 1.7 10*3/uL — ABNORMAL HIGH (ref 0.0–0.7)
Eosinophils Relative: 21.7 % — ABNORMAL HIGH (ref 0.0–5.0)
HCT: 40 % (ref 36.0–46.0)
Hemoglobin: 13.4 g/dL (ref 12.0–15.0)
Lymphocytes Relative: 31.1 % (ref 12.0–46.0)
Lymphs Abs: 2.5 10*3/uL (ref 0.7–4.0)
MCHC: 33.4 g/dL (ref 30.0–36.0)
MCV: 87.4 fl (ref 78.0–100.0)
Monocytes Absolute: 0.4 10*3/uL (ref 0.1–1.0)
Monocytes Relative: 5.5 % (ref 3.0–12.0)
Neutro Abs: 3.3 10*3/uL (ref 1.4–7.7)
Neutrophils Relative %: 41.2 % — ABNORMAL LOW (ref 43.0–77.0)
Platelets: 211 10*3/uL (ref 150.0–400.0)
RBC: 4.58 Mil/uL (ref 3.87–5.11)
RDW: 13.2 % (ref 11.5–15.5)
WBC: 8 10*3/uL (ref 4.0–10.5)

## 2024-03-28 LAB — COMPREHENSIVE METABOLIC PANEL WITH GFR
ALT: 10 U/L (ref 0–35)
AST: 17 U/L (ref 0–37)
Albumin: 4.2 g/dL (ref 3.5–5.2)
Alkaline Phosphatase: 52 U/L (ref 39–117)
BUN: 16 mg/dL (ref 6–23)
CO2: 24 meq/L (ref 19–32)
Calcium: 9.4 mg/dL (ref 8.4–10.5)
Chloride: 103 meq/L (ref 96–112)
Creatinine, Ser: 0.76 mg/dL (ref 0.40–1.20)
GFR: 78.19 mL/min (ref >60.00)
Glucose, Bld: 87 mg/dL (ref 70–99)
Potassium: 4 meq/L (ref 3.5–5.1)
Sodium: 137 meq/L (ref 135–145)
Total Bilirubin: 0.3 mg/dL (ref 0.2–1.2)
Total Protein: 6.7 g/dL (ref 6.0–8.3)

## 2024-03-28 LAB — SEDIMENTATION RATE: Sed Rate: 13 mm/h (ref 0–30)

## 2024-03-28 LAB — HIGH SENSITIVITY CRP: CRP, High Sensitivity: 2.46 mg/L (ref 0.000–5.000)

## 2024-03-28 NOTE — Progress Notes (Signed)
 Chief Complaint: Chronic loose stools Primary GI MD: Dr. General Kenner  HPI: Pamela Daugherty is a 73 year old female with a history of GERD and hiatus hernia who presents with chronic diarrhea and gastrointestinal symptoms.  Patient was last seen 2020 by Dr. General Kenner for IDA and underwent EGD/Colonoscopy which showed inflamed gastric polyp thought to be etiology of her IDA. She was also found to have gastric intestinal metaplasia on her EGD and with no family history there was no recommendation for follow up with surveillance endoscopy.  Discussed the use of AI scribe software for clinical note transcription with the patient, who gave verbal consent to proceed.  History of Present Illness She has experienced chronic diarrhea for several years, with loose stools occurring approximately 80% of the time. She describes alternating periods of diarrhea and constipation, with post-flare-up constipation. There is urgency with bowel movements, and she cannot control her bowels if she doesn't reach the bathroom in time. No blood in her stool, but she notes black stool during the last episode. She has not been taking ibuprofen, Pepto Bismol, or iron, but recently started a vitamin supplement called neutrophil, which may contain iron.  In January, a CT scan revealed ileocolitis, gallstones, and diverticula. She experienced severe lower back pain at that time, initially suspecting kidney issues, but was informed her kidneys were fine. There is no diagnosis of Crohn's disease, although there was some confusion due to comments from others.  She manages her symptoms with Imodium during diarrhea episodes and Nexium  for GERD symptoms. Nexium  provides relief for her GERD and epigastric pain, which she describes as pain in the middle of her back.  Her family history includes similar gastrointestinal issues, as her mother and sister experienced the same symptoms. She has not used fiber supplements or products like  Benefiber or Citrucel but is taking a probiotic vitamin.  During the review of symptoms, she confirmed the presence of GERD, epigastric pain, and diarrhea with urgency.    PREVIOUS GI WORKUP   No gastric cancer in the family No colon cancer in the family   Colonoscopy 06/28/19 - The perianal and digital rectal examinations were normal. - The terminal ileum appeared normal. - Multiple small-mouthed diverticula were found in the ascending colon and left colon. - A 4 mm polyp was found in the transverse colon. The polyp was flat. The polyp was removed with a cold snare. Resection and retrieval were complete. - The exam was otherwise without abnormality. - Biopsies for histology were taken with a cold forceps from the transverse colon, right transverse colon and left transverse colon for evaluation of microscopic colitis.   EGD 06/28/19 - Findings: - A 2 cm hiatal hernia was present. - The exam of the esophagus was otherwise normal. - A single diminutive sessile polyp was found in the gastric antrum. The polyp was removed with a cold biopsy forceps. Resection and retrieval were complete. - A single 4 to 5 mm pedunculated and sessile inflamed polyp with stigmata of recent bleeding was found in the gastric antrum (adherent heme with slight oozing). The polyp was removed with a hot snare. Resection and retrieval were complete. There was persistent oozing noted following polypectomy. Multiple attempts with snare tip coagulation were made for hemostasis however there was persistent oozing at the left lateral margin, For hemostasis, two hemostatic clips were successfully placed and no bleeding was noted following this intervention. - Inflammation characterized by erythema was found in the gastric antrum. - The stomach appeared diffusely  atrophic. The exam of the stomach was otherwise normal. - Biopsies were taken with a cold forceps in the gastric body, at the incisura and in the gastric antrum  for Helicobacter pylori testing. - The duodenal bulb and second portion of the duodenum were normal. Biopsies for histology were taken with a cold forceps for evaluation of celiac disease.  Past Medical History:  Diagnosis Date   Anemia    Anxiety    B12 deficiency    Depression    Esophageal reflux    Esophageal stricture    Fatty liver    GERD (gastroesophageal reflux disease)    Hypertension    IBS (irritable bowel syndrome)    Insomnia    Meniere disease, bilateral    sees Dr. Gustabo Legions with [facility]   Neck pain    Routine gynecological examination    sees Dr. Astrid Blamer     Past Surgical History:  Procedure Laterality Date   APPENDECTOMY     COLONOSCOPY  06/28/2019   per Dr. General Kenner, benign polyps, repeat in 10 yrs    ESOPHAGOGASTRODUODENOSCOPY  06/28/2019   per Dr. General Kenner, benign gastric polyp, source of GI blood loss    TONSILLECTOMY     TUBAL LIGATION      Current Outpatient Medications  Medication Sig Dispense Refill   alendronate  (FOSAMAX ) 10 MG tablet Take 1 tablet (10 mg total) by mouth daily before breakfast. Take with a full glass of water on an empty stomach. 90 tablet 3   ALPRAZolam  (XANAX ) 0.5 MG tablet Take 1 tablet (0.5 mg total) by mouth at bedtime as needed for sleep. 90 tablet 1   aspirin 81 MG EC tablet Take 81 mg by mouth daily.     Cranberry 250 MG TABS Take 250 mg by mouth in the morning and at bedtime.     D-Mannose 500 MG CAPS Take 500 mg by mouth daily.     escitalopram  (LEXAPRO ) 20 MG tablet Take 1 tablet (20 mg total) by mouth daily. 90 tablet 3   esomeprazole  (NEXIUM ) 20 MG capsule Take 1 capsule (20 mg total) by mouth daily at 12 noon. 90 capsule 3   estradiol (ESTRACE) 0.1 MG/GM vaginal cream SMARTSIG:Gram(s) Vaginal Daily     fish oil-omega-3 fatty acids 1000 MG capsule Take 1 g by mouth daily.     ketoconazole (NIZORAL) 2 % cream      Lactobacillus (PROBIOTIC ACIDOPHILUS PO) Take 1 capsule by mouth daily.      olmesartan  (BENICAR ) 20 MG tablet Take 1 tablet (20 mg total) by mouth daily. 90 tablet 3   OVER THE COUNTER MEDICATION Take 1 capsule by mouth daily. Nutrafol.     valACYclovir (VALTREX) 500 MG tablet Take 1,000 mg by mouth 2 (two) times daily.     vitamin B-12 (CYANOCOBALAMIN ) 250 MCG tablet Take 250 mcg by mouth daily.     meclizine  (ANTIVERT ) 25 MG tablet Take 1 tablet (25 mg total) by mouth every 4 (four) hours as needed for dizziness. (Patient not taking: Reported on 03/28/2024) 120 tablet 3   No current facility-administered medications for this visit.    Allergies as of 03/28/2024 - Review Complete 03/28/2024  Allergen Reaction Noted   Penicillins  06/28/2007    Family History  Problem Relation Age of Onset   Coronary artery disease Mother    Diabetes Mother    Irritable bowel syndrome Mother    Heart failure Mother    Healthy Father  Colon cancer Neg Hx    Esophageal cancer Neg Hx    Rectal cancer Neg Hx    Stomach cancer Neg Hx     Social History   Socioeconomic History   Marital status: Widowed    Spouse name: Not on file   Number of children: Not on file   Years of education: Not on file   Highest education level: 12th grade  Occupational History   Occupation: adjuster  Tobacco Use   Smoking status: Never   Smokeless tobacco: Never  Vaping Use   Vaping status: Never Used  Substance and Sexual Activity   Alcohol use: Yes    Alcohol/week: 2.0 standard drinks of alcohol    Types: 2 Standard drinks or equivalent per week   Drug use: No   Sexual activity: Not Currently  Other Topics Concern   Not on file  Social History Narrative   Not on file   Social Drivers of Health   Financial Resource Strain: Low Risk  (04/09/2023)   Overall Financial Resource Strain (CARDIA)    Difficulty of Paying Living Expenses: Not very hard  Food Insecurity: No Food Insecurity (04/09/2023)   Hunger Vital Sign    Worried About Running Out of Food in the Last Year: Never true     Ran Out of Food in the Last Year: Never true  Transportation Needs: No Transportation Needs (04/09/2023)   PRAPARE - Administrator, Civil Service (Medical): No    Lack of Transportation (Non-Medical): No  Physical Activity: Insufficiently Active (04/09/2023)   Exercise Vital Sign    Days of Exercise per Week: 3 days    Minutes of Exercise per Session: 20 min  Stress: Stress Concern Present (04/09/2023)   [employer] of Occupational Health - Occupational Stress Questionnaire    Feeling of Stress : Very much  Social Connections: Moderately Integrated (04/09/2023)   Social Connection and Isolation Panel [NHANES]    Frequency of Communication with Friends and Family: More than three times a week    Frequency of Social Gatherings with Friends and Family: Once a week    Attends Religious Services: More than 4 times per year    Active Member of [employer] or Organizations: Yes    Attends Banker Meetings: More than 4 times per year    Marital Status: Widowed  Intimate Partner Violence: Not At Risk (07/31/2023)   Humiliation, Afraid, Rape, and Kick questionnaire    Fear of Current or Ex-Partner: No    Emotionally Abused: No    Physically Abused: No    Sexually Abused: No    Review of Systems:    Constitutional: No weight loss, fever, chills, weakness or fatigue HEENT: Eyes: No change in vision               Ears, Nose, Throat:  No change in hearing or congestion Skin: No rash or itching Cardiovascular: No chest pain, chest pressure or palpitations   Respiratory: No SOB or cough Gastrointestinal: See HPI and otherwise negative Genitourinary: No dysuria or change in urinary frequency Neurological: No headache, dizziness or syncope Musculoskeletal: No new muscle or joint pain Hematologic: No bleeding or bruising Psychiatric: No history of depression or anxiety    Physical Exam:  Vital signs: BP 110/70   Pulse 80   Ht 5\' 8"  (1.727 m)   Wt 178 lb 2 oz (80.8  kg)   SpO2 96%   BMI 27.08 kg/m   Constitutional: NAD, alert and  cooperative Head:  Normocephalic and atraumatic. Eyes:   PEERL, EOMI. No icterus. Conjunctiva pink. Respiratory: Respirations even and unlabored. Lungs clear to auscultation bilaterally.   No wheezes, crackles, or rhonchi.  Cardiovascular:  Regular rate and rhythm. No peripheral edema, cyanosis or pallor.  Gastrointestinal:  Soft, nondistended, nontender. No rebound or guarding. Normal bowel sounds. No appreciable masses or hepatomegaly. Rectal:  Declines Msk:  Symmetrical without gross deformities. Without edema, no deformity or joint abnormality.  Neurologic:  Alert and  oriented x4;  grossly normal neurologically.  Skin:   Dry and intact without significant lesions or rashes. Psychiatric: Oriented to person, place and time. Demonstrates good judgement and reason without abnormal affect or behaviors.  RELEVANT LABS AND IMAGING: CBC    Component Value Date/Time   WBC 6.1 07/10/2023 0936   RBC 4.58 07/10/2023 0936   HGB 13.6 07/10/2023 0936   HCT 40.5 07/10/2023 0936   PLT 235.0 07/10/2023 0936   MCV 88.5 07/10/2023 0936   MCH 28.6 06/05/2020 0836   MCHC 33.4 07/10/2023 0936   RDW 13.2 07/10/2023 0936   LYMPHSABS 2.5 07/10/2023 0936   MONOABS 0.4 07/10/2023 0936   EOSABS 0.2 07/10/2023 0936   BASOSABS 0.0 07/10/2023 0936    CMP     Component Value Date/Time   NA 136 07/10/2023 0936   K 3.8 07/10/2023 0936   CL 98 07/10/2023 0936   CO2 28 07/10/2023 0936   GLUCOSE 84 07/10/2023 0936   BUN 19 07/10/2023 0936   CREATININE 0.88 07/10/2023 0936   CREATININE 0.70 06/05/2020 0836   CALCIUM 9.7 07/10/2023 0936   PROT 7.0 07/10/2023 0936   ALBUMIN 4.3 07/10/2023 0936   AST 24 07/10/2023 0936   ALT 17 07/10/2023 0936   ALKPHOS 46 07/10/2023 0936   BILITOT 0.6 07/10/2023 0936   GFRNONAA 92.64 05/19/2010 0836   GFRAA 95 02/27/2008 0909     Assessment/Plan:   Chronic alternating diarrhea and  constipation Lower abdominal pain Fecal urgency 80% loose stools with occasional urgency and lower abdominal pain that improves after a bowel movement; when not having loose stools she has constipation. Suspect mixed type IBS. However, CT with possible ileocolitis. Colonoscopy in 2020 with normal terminal ileum and negative microscopic colitis. No previous trial of fiber. Less suspicious for IBD, but cannot rule out. Patient would prefer to avoid colonoscopy. -- trial of benefiber 1-2 tablespoons daily -- provided samples of IB gard -- Fecal calprotectin -- CBC, CMP, ESR/CRP -- if no improvement with benefiber and continued symptoms, will consider repeat CT with contrast -- follow up 12 weeks   GERD Gastric intestinal metaplasia Gastric intestinal metaplasia found on EGD in 2020 with no surviellence recommendation as patient has no family history or other risk factors. Previously on nexium  40mg  once daily. Recurrence of symptoms when she stopped medication now improved back on meds -- Continue Nexium  40 Mg once daily - Educated patient on lifestyle modifications  Colon cancer screening Colonoscopy in 2020 with benign precancerous polyp   Emmitt Matthews Lorina Roosevelt  Gastroenterology 03/28/2024, 11:32 AM  Cc: Donley Furth, MD

## 2024-03-28 NOTE — Progress Notes (Signed)
 Agree with assessment and plan as outlined.  With her symptoms and CT scan findings, colonoscopy is the best test to further evaluate.  It sounds like she is hoping to avoid that.  I do agree with fecal calprotectin and markers of inflammation.  If these tests are elevated, would really see if she is willing to do colonoscopy to further evaluate in that setting rather than a repeat CT scan.

## 2024-03-28 NOTE — Patient Instructions (Addendum)
 A high fiber diet with plenty of fluids (up to 8 glasses of water daily) is suggested to relieve these symptoms.  Benefiber, 1 tablespoon once or twice daily can be used to keep bowels regular if needed.   Please follow up with Dr. General Kenner in 3 months  _______________________________________________________  If your blood pressure at your visit was 140/90 or greater, please contact your primary care physician to follow up on this.  _______________________________________________________  If you are age 35 or older, your body mass index should be between 23-30. Your Body mass index is 27.08 kg/m. If this is out of the aforementioned range listed, please consider follow up with your Primary Care Provider.  If you are age 71 or younger, your body mass index should be between 19-25. Your Body mass index is 27.08 kg/m. If this is out of the aformentioned range listed, please consider follow up with your Primary Care Provider.   ________________________________________________________  The [facility] providers would like to encourage you to use MYCHART to communicate with providers for non-urgent requests or questions.  Due to long hold times on the telephone, sending your provider a message by [facility] may be a faster and more efficient way to get a response.  Please allow 48 business hours for a response.  Please remember that this is for non-urgent requests.  _______________________________________________________

## 2024-03-29 ENCOUNTER — Ambulatory Visit: Payer: Self-pay | Admitting: Gastroenterology

## 2024-03-29 ENCOUNTER — Other Ambulatory Visit

## 2024-03-29 DIAGNOSIS — R1030 Lower abdominal pain, unspecified: Secondary | ICD-10-CM

## 2024-03-29 DIAGNOSIS — R198 Other specified symptoms and signs involving the digestive system and abdomen: Secondary | ICD-10-CM

## 2024-03-29 DIAGNOSIS — R195 Other fecal abnormalities: Secondary | ICD-10-CM

## 2024-03-29 DIAGNOSIS — R935 Abnormal findings on diagnostic imaging of other abdominal regions, including retroperitoneum: Secondary | ICD-10-CM | POA: Diagnosis not present

## 2024-03-29 DIAGNOSIS — R197 Diarrhea, unspecified: Secondary | ICD-10-CM

## 2024-04-01 LAB — CALPROTECTIN, FECAL: Calprotectin, Fecal: 201 ug/g — ABNORMAL HIGH (ref 0–120)

## 2024-04-04 DIAGNOSIS — H524 Presbyopia: Secondary | ICD-10-CM | POA: Diagnosis not present

## 2024-04-04 DIAGNOSIS — H2513 Age-related nuclear cataract, bilateral: Secondary | ICD-10-CM | POA: Diagnosis not present

## 2024-04-04 DIAGNOSIS — H5213 Myopia, bilateral: Secondary | ICD-10-CM | POA: Diagnosis not present

## 2024-04-11 ENCOUNTER — Ambulatory Visit: Admitting: Gastroenterology

## 2024-04-11 ENCOUNTER — Encounter: Payer: Self-pay | Admitting: Gastroenterology

## 2024-04-11 VITALS — BP 131/69 | HR 66 | Temp 97.6°F | Resp 19 | Ht 68.0 in | Wt 178.0 lb

## 2024-04-11 DIAGNOSIS — K635 Polyp of colon: Secondary | ICD-10-CM

## 2024-04-11 DIAGNOSIS — D120 Benign neoplasm of cecum: Secondary | ICD-10-CM | POA: Diagnosis not present

## 2024-04-11 DIAGNOSIS — D123 Benign neoplasm of transverse colon: Secondary | ICD-10-CM

## 2024-04-11 DIAGNOSIS — K648 Other hemorrhoids: Secondary | ICD-10-CM | POA: Diagnosis not present

## 2024-04-11 DIAGNOSIS — R195 Other fecal abnormalities: Secondary | ICD-10-CM | POA: Diagnosis not present

## 2024-04-11 DIAGNOSIS — K5730 Diverticulosis of large intestine without perforation or abscess without bleeding: Secondary | ICD-10-CM | POA: Diagnosis not present

## 2024-04-11 DIAGNOSIS — R194 Change in bowel habit: Secondary | ICD-10-CM | POA: Diagnosis not present

## 2024-04-11 MED ORDER — SODIUM CHLORIDE 0.9 % IV SOLN: 500.0000 mL | Freq: Once | INTRAVENOUS | Status: DC

## 2024-04-11 NOTE — Progress Notes (Signed)
 Report to PACU, RN, vss, BBS= Clear.

## 2024-04-11 NOTE — Patient Instructions (Signed)
 Please read handouts provided. Continue present medications. Await pathology results. Can try scheduled Imodium every morning and titrate up as needed.   YOU HAD AN ENDOSCOPIC PROCEDURE TODAY AT THE [facility]:   Refer to the procedure report that was given to you for any specific questions about what was found during the examination.  If the procedure report does not answer your questions, please call your gastroenterologist to clarify.  If you requested that your care partner not be given the details of your procedure findings, then the procedure report has been included in a sealed envelope for you to review at your convenience later.  YOU SHOULD EXPECT: Some feelings of bloating in the abdomen. Passage of more gas than usual.  Walking can help get rid of the air that was put into your GI tract during the procedure and reduce the bloating. If you had a lower endoscopy (such as a colonoscopy or flexible sigmoidoscopy) you may notice spotting of blood in your stool or on the toilet paper. If you underwent a bowel prep for your procedure, you may not have a normal bowel movement for a few days.  Please Note:  You might notice some irritation and congestion in your nose or some drainage.  This is from the oxygen used during your procedure.  There is no need for concern and it should clear up in a day or so.  SYMPTOMS TO REPORT IMMEDIATELY:  Following lower endoscopy (colonoscopy or flexible sigmoidoscopy):  Excessive amounts of blood in the stool  Significant tenderness or worsening of abdominal pains  Swelling of the abdomen that is new, acute  Fever of 100F or higher  For urgent or emergent issues, a gastroenterologist can be reached at any hour by calling [phone]. Do not use MyChart messaging for urgent concerns.    DIET:  We do recommend a small meal at first, but then you may proceed to your regular diet.  Drink plenty of fluids but you should avoid alcoholic  beverages for 24 hours.  ACTIVITY:  You should plan to take it easy for the rest of today and you should NOT DRIVE or use heavy machinery until tomorrow (because of the sedation medicines used during the test).    FOLLOW UP: Our staff will call the number listed on your records the next business day following your procedure.  We will call around 7:15- 8:00 am to check on you and address any questions or concerns that you may have regarding the information given to you following your procedure. If we do not reach you, we will leave a message.     If any biopsies were taken you will be contacted by phone or by letter within the next 1-3 weeks.  Please call us  at [phone] if you have not heard about the biopsies in 3 weeks.    SIGNATURES/CONFIDENTIALITY: You and/or your care partner have signed paperwork which will be entered into your electronic medical record.  These signatures attest to the fact that that the information above on your After Visit Summary has been reviewed and is understood.  Full responsibility of the confidentiality of this discharge information lies with you and/or your care-partner.

## 2024-04-11 NOTE — Op Note (Signed)
 [facility] Patient Name: Pamela Daugherty Procedure Date: 04/11/2024 7:57 AM MRN: [id number] Endoscopist: Landon Pinion P. General Kenner , MD, [id number] Age: 73 Referring MD:  Date of Birth: 08-22-1951 Gender: Female Account #: [id number] Procedure:                Colonoscopy Indications:              Chronic diarrhea, elevated fecal calprotectin to                            200s. CT scan earlier in the year showed                            ileocolitis Medicines:                Monitored Anesthesia Care Procedure:                Pre-Anesthesia Assessment:                           - Prior to the procedure, a History and Physical                            was performed, and patient medications and                            allergies were reviewed. The patient's tolerance of                            previous anesthesia was also reviewed. The risks                            and benefits of the procedure and the sedation                            options and risks were discussed with the patient.                            All questions were answered, and informed consent                            was obtained. Prior Anticoagulants: The patient has                            taken no anticoagulant or antiplatelet agents. ASA                            Grade Assessment: II - A patient with mild systemic                            disease. After reviewing the risks and benefits,                            the patient was deemed in satisfactory condition to  undergo the procedure.                           After obtaining informed consent, the colonoscope                            was passed under direct vision. Throughout the                            procedure, the patient's blood pressure, pulse, and                            oxygen saturations were monitored continuously. The                            Olympus Scope SN: [id number] was introduced through                             the anus and advanced to the the terminal ileum,                            with identification of the appendiceal orifice and                            IC valve. The colonoscopy was performed without                            difficulty. The patient tolerated the procedure                            well. The quality of the bowel preparation was                            adequate. The terminal ileum, ileocecal valve,                            appendiceal orifice, and rectum were photographed. Scope In: 8:05:38 AM Scope Out: 8:26:23 AM Scope Withdrawal Time: 0 hours 15 minutes 40 seconds  Total Procedure Duration: 0 hours 20 minutes 45 seconds  Findings:                 The perianal and digital rectal examinations were                            normal.                           The terminal ileum appeared normal.                           Many diverticula were found in the entire colon.                            Highest burden in the sigmoid colon with luminal  narrowing / restricted mobility.                           A 3 to 4 mm polyp was found in the cecum. The polyp                            was flat. The polyp was removed with a cold snare.                            Resection and retrieval were complete.                           A 4 to 5 mm polyp was found in the transverse                            colon. The polyp was flat. The polyp was removed                            with a cold snare. Resection and retrieval were                            complete.                           Internal hemorrhoids were found during retroflexion.                           The exam was otherwise without abnormality.                           Biopsies for histology were taken with a cold                            forceps from the right colon, left colon and                            transverse colon for evaluation of microscopic                             colitis. Complications:            No immediate complications. Estimated blood loss:                            Minimal. Estimated Blood Loss:     Estimated blood loss was minimal. Impression:               - The examined portion of the ileum was normal.                           - Diverticulosis in the entire examined colon.                           - One 3 to 4 mm polyp in the cecum, removed with a  cold snare. Resected and retrieved.                           - One 4 to 5 mm polyp in the transverse colon,                            removed with a cold snare. Resected and retrieved.                           - Internal hemorrhoids.                           - The examination was otherwise normal.                           - Biopsies were taken with a cold forceps from the                            right colon, left colon and transverse colon for                            evaluation of microscopic colitis.                           No overt inflammation seen, perhaps she had                            infectious colitis back in January on CT. Biopsies                            taken to rule out microscopic colitis. Recommendation:           - Patient has a contact number available for                            emergencies. The signs and symptoms of potential                            delayed complications were discussed with the                            patient. Return to normal activities tomorrow.                            Written discharge instructions were provided to the                            patient.                           - Resume previous diet.                           - Continue present medications.                           -  Await pathology results.                           - Can try scheduled immodium q AM and titrate up as                            needed. Landon Pinion P. Karmon Andis, MD 04/11/2024 8:32:18  AM This report has been signed electronically.

## 2024-04-11 NOTE — Progress Notes (Signed)
 History and Physical Interval Note: NO changes since office visit recently. Persistent loose stools, elevated fecal calprotectin to 200s. Colonoscopy to further evaluate. She otherwise denies complaints today, feels at baseline.   04/11/2024 8:00 AM  Pamela Daugherty  has presented today for endoscopic procedure(s), with the diagnosis of  Encounter Diagnoses  Name Primary?   Altered bowel habits Yes   Elevated fecal calprotectin   .  The various methods of evaluation and treatment have been discussed with the patient and/or family. After consideration of risks, benefits and other options for treatment, the patient has consented to  the endoscopic procedure(s).   The patient's history has been reviewed, patient examined, no change in status, stable for surgery.  I have reviewed the patient's chart and labs.  Questions were answered to the patient's satisfaction.    Christi Coward, MD [facility]

## 2024-04-11 NOTE — Progress Notes (Signed)
 Called to room to assist during endoscopic procedure.  Patient ID and intended procedure confirmed with present staff. Received instructions for my participation in the procedure from the performing physician.

## 2024-04-12 ENCOUNTER — Telehealth: Payer: Self-pay | Admitting: *Deleted

## 2024-04-12 NOTE — Telephone Encounter (Signed)
  Follow up Call-     04/11/2024    7:29 AM  Call back number  Post procedure Call Back phone  # [phone]  Permission to leave phone message Yes     Patient questions:  Do you have a fever, pain , or abdominal swelling? No. Pain Score  0 *  Have you tolerated food without any problems? Yes.    Have you been able to return to your normal activities? Yes.    Do you have any questions about your discharge instructions: Diet   No. Medications  No. Follow up visit  No.  Do you have questions or concerns about your Care? No.  Actions: * If pain score is 4 or above: No action needed, pain <4.

## 2024-04-15 LAB — SURGICAL PATHOLOGY

## 2024-04-17 ENCOUNTER — Ambulatory Visit: Payer: Self-pay | Admitting: Gastroenterology

## 2024-04-25 ENCOUNTER — Encounter: Payer: Self-pay | Admitting: Family Medicine

## 2024-04-25 DIAGNOSIS — F418 Other specified anxiety disorders: Secondary | ICD-10-CM

## 2024-04-29 MED ORDER — ESCITALOPRAM OXALATE 20 MG PO TABS: 20.0000 mg | ORAL_TABLET | Freq: Every day | ORAL | 3 refills | Status: AC

## 2024-04-29 MED ORDER — OLMESARTAN MEDOXOMIL 20 MG PO TABS: 20.0000 mg | ORAL_TABLET | Freq: Every day | ORAL | 3 refills | Status: AC

## 2024-06-14 ENCOUNTER — Encounter: Payer: Self-pay | Admitting: Family Medicine

## 2024-06-14 MED ORDER — TOBRAMYCIN 0.3 % OP SOLN: 2.0000 [drp] | OPHTHALMIC | 0 refills | Status: AC

## 2024-06-14 NOTE — Telephone Encounter (Signed)
 I sent in Tobramycin  drops

## 2024-07-16 ENCOUNTER — Ambulatory Visit (INDEPENDENT_AMBULATORY_CARE_PROVIDER_SITE_OTHER): Admitting: Family Medicine

## 2024-07-16 ENCOUNTER — Ambulatory Visit: Payer: Self-pay | Admitting: Family Medicine

## 2024-07-16 ENCOUNTER — Encounter: Payer: Self-pay | Admitting: Family Medicine

## 2024-07-16 ENCOUNTER — Ambulatory Visit: Admitting: Gastroenterology

## 2024-07-16 VITALS — BP 110/62 | HR 78 | Temp 97.7°F | Ht 67.5 in | Wt 173.0 lb

## 2024-07-16 DIAGNOSIS — Z23 Encounter for immunization: Secondary | ICD-10-CM

## 2024-07-16 DIAGNOSIS — Z780 Asymptomatic menopausal state: Secondary | ICD-10-CM

## 2024-07-16 DIAGNOSIS — M810 Age-related osteoporosis without current pathological fracture: Secondary | ICD-10-CM | POA: Insufficient documentation

## 2024-07-16 DIAGNOSIS — K219 Gastro-esophageal reflux disease without esophagitis: Secondary | ICD-10-CM | POA: Diagnosis not present

## 2024-07-16 DIAGNOSIS — D509 Iron deficiency anemia, unspecified: Secondary | ICD-10-CM

## 2024-07-16 DIAGNOSIS — F418 Other specified anxiety disorders: Secondary | ICD-10-CM

## 2024-07-16 DIAGNOSIS — M5431 Sciatica, right side: Secondary | ICD-10-CM | POA: Insufficient documentation

## 2024-07-16 DIAGNOSIS — R739 Hyperglycemia, unspecified: Secondary | ICD-10-CM | POA: Diagnosis not present

## 2024-07-16 DIAGNOSIS — I10 Essential (primary) hypertension: Secondary | ICD-10-CM | POA: Diagnosis not present

## 2024-07-16 LAB — CBC WITH DIFFERENTIAL/PLATELET
Basophils Absolute: 0.1 K/uL (ref 0.0–0.1)
Basophils Relative: 0.8 % (ref 0.0–3.0)
Eosinophils Absolute: 0.2 K/uL (ref 0.0–0.7)
Eosinophils Relative: 3.9 % (ref 0.0–5.0)
HCT: 40.4 % (ref 36.0–46.0)
Hemoglobin: 13.7 g/dL (ref 12.0–15.0)
Lymphocytes Relative: 38.8 % (ref 12.0–46.0)
Lymphs Abs: 2.4 K/uL (ref 0.7–4.0)
MCHC: 33.9 g/dL (ref 30.0–36.0)
MCV: 87 fl (ref 78.0–100.0)
Monocytes Absolute: 0.4 K/uL (ref 0.1–1.0)
Monocytes Relative: 6.4 % (ref 3.0–12.0)
Neutro Abs: 3.1 K/uL (ref 1.4–7.7)
Neutrophils Relative %: 50.1 % (ref 43.0–77.0)
Platelets: 230 K/uL (ref 150.0–400.0)
RBC: 4.65 Mil/uL (ref 3.87–5.11)
RDW: 12.5 % (ref 11.5–15.5)
WBC: 6.3 K/uL (ref 4.0–10.5)

## 2024-07-16 LAB — BASIC METABOLIC PANEL WITH GFR
BUN: 15 mg/dL (ref 6–23)
CO2: 25 meq/L (ref 19–32)
Calcium: 9.5 mg/dL (ref 8.4–10.5)
Chloride: 103 meq/L (ref 96–112)
Creatinine, Ser: 0.76 mg/dL (ref 0.40–1.20)
GFR: 78.02 mL/min (ref >60.00)
Glucose, Bld: 93 mg/dL (ref 70–99)
Potassium: 3.9 meq/L (ref 3.5–5.1)
Sodium: 138 meq/L (ref 135–145)

## 2024-07-16 LAB — IBC + FERRITIN
Ferritin: 23.3 ng/mL (ref 10.0–291.0)
Iron: 103 ug/dL (ref 42–145)
Saturation Ratios: 28.5 % (ref 20.0–50.0)
TIBC: 361.2 ug/dL (ref 250.0–450.0)
Transferrin: 258 mg/dL (ref 212.0–360.0)

## 2024-07-16 LAB — HEPATIC FUNCTION PANEL
ALT: 10 U/L (ref 0–35)
AST: 17 U/L (ref 0–37)
Albumin: 4.3 g/dL (ref 3.5–5.2)
Alkaline Phosphatase: 45 U/L (ref 39–117)
Bilirubin, Direct: 0.1 mg/dL (ref 0.0–0.3)
Total Bilirubin: 0.5 mg/dL (ref 0.2–1.2)
Total Protein: 6.8 g/dL (ref 6.0–8.3)

## 2024-07-16 LAB — LIPID PANEL
Cholesterol: 183 mg/dL (ref 0–200)
HDL: 51.2 mg/dL (ref >39.00)
LDL Cholesterol: 96 mg/dL (ref 0–99)
NonHDL: 131.57
Total CHOL/HDL Ratio: 4
Triglycerides: 179 mg/dL — ABNORMAL HIGH (ref 0.0–149.0)
VLDL: 35.8 mg/dL (ref 0.0–40.0)

## 2024-07-16 LAB — TSH: TSH: 2.38 u[IU]/mL (ref 0.35–5.50)

## 2024-07-16 LAB — HEMOGLOBIN A1C: Hgb A1c MFr Bld: 5.9 % (ref 4.6–6.5)

## 2024-07-16 MED ORDER — ESOMEPRAZOLE MAGNESIUM 20 MG PO CPDR: 20.0000 mg | DELAYED_RELEASE_CAPSULE | Freq: Every day | ORAL | 3 refills | Status: AC

## 2024-07-16 MED ORDER — ALENDRONATE SODIUM 10 MG PO TABS: 10.0000 mg | ORAL_TABLET | Freq: Every day | ORAL | 3 refills | Status: AC

## 2024-07-16 MED ORDER — MELOXICAM 7.5 MG PO TABS: 7.5000 mg | ORAL_TABLET | Freq: Every day | ORAL | Status: AC

## 2024-07-16 MED ORDER — ALPRAZOLAM 0.5 MG PO TABS: 0.5000 mg | ORAL_TABLET | Freq: Every evening | ORAL | 1 refills | Status: AC | PRN

## 2024-07-16 NOTE — Progress Notes (Signed)
 Subjective:    Patient ID: Nathanel LITTIE Ghent, female    DOB: June 08, 1951, 73 y.o.   MRN: [id number]  HPI Here to follow up on issues. Her HTN and GERD have been stable. She has been taking Alendronate  for the past 2 and 1/2 years for osteoporosis, and her last DEXA was in January 2023. She is seeing Dr. Baird for right sided sciatica, and she is taking Meloxicam  for this with mixed results. Her depression with anxiety is stable.    Review of Systems  Constitutional: Negative.   HENT: Negative.    Eyes: Negative.   Respiratory: Negative.    Cardiovascular: Negative.   Gastrointestinal: Negative.   Genitourinary:  Negative for decreased urine volume, difficulty urinating, dyspareunia, dysuria, enuresis, flank pain, frequency, hematuria, pelvic pain and urgency.  Musculoskeletal:  Positive for back pain.  Skin: Negative.   Neurological: Negative.  Negative for headaches.  Psychiatric/Behavioral: Negative.         Objective:   Physical Exam Constitutional:      General: She is not in acute distress.    Appearance: Normal appearance. She is well-developed.  HENT:     Head: Normocephalic and atraumatic.     Right Ear: External ear normal.     Left Ear: External ear normal.     Nose: Nose normal.     Mouth/Throat:     Pharynx: No oropharyngeal exudate.  Eyes:     General: No scleral icterus.    Conjunctiva/sclera: Conjunctivae normal.     Pupils: Pupils are equal, round, and reactive to light.  Neck:     Thyroid : No thyromegaly.     Vascular: No JVD.  Cardiovascular:     Rate and Rhythm: Normal rate and regular rhythm.     Pulses: Normal pulses.     Heart sounds: Normal heart sounds. No murmur heard.    No friction rub. No gallop.  Pulmonary:     Effort: Pulmonary effort is normal. No respiratory distress.     Breath sounds: Normal breath sounds. No wheezing or rales.  Chest:     Chest wall: No tenderness.  Abdominal:     General: Bowel sounds are normal. There is no  distension.     Palpations: Abdomen is soft. There is no mass.     Tenderness: There is no abdominal tenderness. There is no guarding or rebound.  Musculoskeletal:        General: No tenderness. Normal range of motion.     Cervical back: Normal range of motion and neck supple.  Lymphadenopathy:     Cervical: No cervical adenopathy.  Skin:    General: Skin is warm and dry.     Findings: No erythema or rash.  Neurological:     General: No focal deficit present.     Mental Status: She is alert and oriented to person, place, and time.     Cranial Nerves: No cranial nerve deficit.     Motor: No abnormal muscle tone.     Coordination: Coordination normal.     Deep Tendon Reflexes: Reflexes are normal and symmetric. Reflexes normal.  Psychiatric:        Mood and Affect: Mood normal.        Behavior: Behavior normal.        Thought Content: Thought content normal.        Judgment: Judgment normal.           Assessment & Plan:  Her GERD and HTN and depression  with anxiety are well controlled. She will follow up with Dr. Baird for sciatica. We will set up another DEXA for the osteoporosis. We will get labs today to check her iron, lipids, etc. We spent a total of ( 32  ) minutes reviewing records and discussing these issues.  Garnette Olmsted, MD

## 2024-07-16 NOTE — Addendum Note (Signed)
 Addended by: LADONNA INOCENTE SAILOR on: 07/16/2024 12:16 PM   Modules accepted: Orders

## 2024-08-02 ENCOUNTER — Encounter

## 2024-08-23 ENCOUNTER — Other Ambulatory Visit (INDEPENDENT_AMBULATORY_CARE_PROVIDER_SITE_OTHER)

## 2024-08-23 ENCOUNTER — Ambulatory Visit

## 2024-08-23 ENCOUNTER — Ambulatory Visit: Admitting: Gastroenterology

## 2024-08-23 ENCOUNTER — Encounter: Payer: Self-pay | Admitting: Gastroenterology

## 2024-08-23 VITALS — BP 136/74 | HR 74 | Ht 67.0 in | Wt 176.4 lb

## 2024-08-23 DIAGNOSIS — R194 Change in bowel habit: Secondary | ICD-10-CM

## 2024-08-23 DIAGNOSIS — K31A0 Gastric intestinal metaplasia, unspecified: Secondary | ICD-10-CM | POA: Diagnosis not present

## 2024-08-23 DIAGNOSIS — R140 Abdominal distension (gaseous): Secondary | ICD-10-CM | POA: Diagnosis not present

## 2024-08-23 DIAGNOSIS — K219 Gastro-esophageal reflux disease without esophagitis: Secondary | ICD-10-CM | POA: Diagnosis not present

## 2024-08-23 DIAGNOSIS — Z860100 Personal history of colon polyps, unspecified: Secondary | ICD-10-CM

## 2024-08-23 MED ORDER — COLESTIPOL HCL 1 G PO TABS: ORAL_TABLET | ORAL | 1 refills | Status: AC

## 2024-08-23 NOTE — Patient Instructions (Addendum)
 Please go to the lab in the basement of our building to have lab work done as you leave today. Hit B for basement when you get on the elevator.  When the doors open the lab is on your left.  We will call you with the results. Thank you.  We are giving you a Low-FODMAP diet handout today. FODMAPs are short-chain carbohydrates (sugars) that are highly fermentable, which means that they go through chemical changes in the GI system, and are poorly absorbed during digestion. When FODMAPs reach the colon (large intestine), bacteria ferment these sugars, turning them into gas and chemicals. This stretches the walls of the colon, causing abdominal bloating, distension, cramping, pain, and/or changes in bowel habits in many patients with IBS. FODMAPs are not unhealthy or harmful, but may exacerbate GI symptoms in those with sensitive GI tracts.  We have given you samples of the following medication to take: FODZYMES  We have sent the following medications to your pharmacy for you to pick up at your convenience: Colestid 1 g: Take once to twice daily as needed  Thank you for entrusting me with your care and for choosing [facility], Dr. Elspeth Naval    _______________________________________________________  If your blood pressure at your visit was 140/90 or greater, please contact your primary care physician to follow up on this.  _______________________________________________________  If you are age 73 or older, your body mass index should be between 23-30. Your Body mass index is 27.62 kg/m. If this is out of the aforementioned range listed, please consider follow up with your Primary Care Provider.  If you are age 39 or younger, your body mass index should be between 19-25. Your Body mass index is 27.62 kg/m. If this is out of the aformentioned range listed, please consider follow up with your Primary Care Provider.   ________________________________________________________  The  [facility] providers would like to encourage you to use MYCHART to communicate with providers for non-urgent requests or questions.  Due to long hold times on the telephone, sending your provider a message by [facility] may be a faster and more efficient way to get a response.  Please allow 48 business hours for a response.  Please remember that this is for non-urgent requests.  _______________________________________________________  [facility] is using a team-based approach to care.  Your team is made up of your doctor and two to three APPS. Our APPS (Nurse Practitioners and Physician Assistants) work with your physician to ensure care continuity for you. They are fully qualified to address your health concerns and develop a treatment plan. They communicate directly with your gastroenterologist to care for you. Seeing the Advanced Practice Practitioners on your physician's team can help you by facilitating care more promptly, often allowing for earlier appointments, access to diagnostic testing, procedures, and other specialty referrals.

## 2024-08-23 NOTE — Progress Notes (Signed)
 HPI :  73 year old female here for follow-up visit.  She has a remote history of IDA, led to endoscopic evaluation in 2020.  IDA has since resolved.  She has been followed for altered bowel habits over this past year.  She had a CT scan done by her urologist back in January looking for kidney stones and was found to have some inflammation of her ileum and colon.  Follow-up fecal calprotectin was mildly elevated in 200s.  She underwent colonoscopy with me in June.  There was no inflammation in the ileum or colon anywhere.  Biopsies were taken and normal.  She had a few small polyps removed.  I had told her to use Imodium as needed.  She states over time her bowel function has improved.  She previously was having loose stools before majority of the time.  She states now it is about 30% of the time, usually postprandial related when she is eating out of her house.  However, this really bothers her when it does occur.  No blood in her stool.  She states she has flareups where she will have multiple loose stools after eating.  Outside of this time, her bowels are fairly normal and she does not have constipation.  Imodium can definitely help when she takes it however she often forgets to take this prior to leaving the house.  Her gallbladder is in place.  Specific foods such as salads or vegetables can often lead to her symptoms and lead to excessive gas and bloating.  She was told to try a fiber supplement and use some Benefiber which she states caused her to blow up.  She denies any routine NSAID use despite what is listed on her chart, she does not use Mobic  daily.  Otherwise she uses Nexium  as needed for reflux.  Does not use this frequently and reflux not really bothering her too much.  She has no family history of gastric cancer.  Recall she had focal GIM noted on a gastric biopsy in 2020.  Had not pursued surveillance based on prior discussion with her.  She denies any upper tract symptoms otherwise.   We discussed options.     PREVIOUS GI WORKUP    No gastric cancer in the family No colon cancer in the family   Colonoscopy 06/28/19 - The perianal and digital rectal examinations were normal. - The terminal ileum appeared normal. - Multiple small-mouthed diverticula were found in the ascending colon and left colon. - A 4 mm polyp was found in the transverse colon. The polyp was flat. The polyp was removed with a cold snare. Resection and retrieval were complete. - The exam was otherwise without abnormality. - Biopsies for histology were taken with a cold forceps from the transverse colon, right transverse colon and left transverse colon for evaluation of microscopic colitis.   EGD 06/28/19 - - A 2 cm hiatal hernia was present. - The exam of the esophagus was otherwise normal. - A single diminutive sessile polyp was found in the gastric antrum. The polyp was removed with a cold biopsy forceps. Resection and retrieval were complete. - A single 4 to 5 mm pedunculated and sessile inflamed polyp with stigmata of recent bleeding was found in the gastric antrum (adherent heme with slight oozing). The polyp was removed with a hot snare. Resection and retrieval were complete. There was persistent oozing noted following polypectomy. Multiple attempts with snare tip coagulation were made for hemostasis however there was persistent oozing at the  left lateral margin, For hemostasis, two hemostatic clips were successfully placed and no bleeding was noted following this intervention. - Inflammation characterized by erythema was found in the gastric antrum. - The stomach appeared diffusely atrophic. The exam of the stomach was otherwise normal. - Biopsies were taken with a cold forceps in the gastric body, at the incisura and in the gastric antrum for Helicobacter pylori testing. - The duodenal bulb and second portion of the duodenum were normal. Biopsies for histology were taken with a cold forceps  for evaluation of celiac disease.   Fecal calprotectin 03/29/24 - 205  Colonoscopy 04/11/24: - The perianal and digital rectal examinations were normal. - The terminal ileum appeared normal. - Many diverticula were found in the entire colon. Highest burden in the sigmoid colon with luminal narrowing / restricted mobility. - A 3 to 4 mm polyp was found in the cecum. The polyp was flat. The polyp was removed with a cold snare. Resection and retrieval were complete. - A 4 to 5 mm polyp was found in the transverse colon. The polyp was flat. The polyp was removed with a cold snare. Resection and retrieval were complete. - Internal hemorrhoids were found during retroflexion. - The exam was otherwise without abnormality. - Biopsies for histology were taken with a cold forceps from the right colon, left colon and transverse colon for evaluation of microscopic colitis.  FINAL DIAGNOSIS        1. Surgical [P], colon, cecum, transverse, polyp (2) :       - SESSILE SERRATED POLYP.       - NO DYSPLASIA OR MALIGNANCY.       - POLYPOID FRAGMENT OF BENIGN COLONIC MUCOSA WITH MILD HYPERPLASTIC CHANGE        2. Surgical [P], colon nos, random sites :       - BENIGN COLONIC MUCOSA WITH NO SPECIFIC PATHOLOGIC CHANGES       - NEGATIVE FOR INCREASED INTRAEPITHELIAL LYMPHOCYTES OR THICKENED SUBEPITHELIAL       COLLAGEN TABLE       - NEGATIVE FOR DYSPLASIA OR MALIGNANCY   Past Medical History:  Diagnosis Date   Anemia    Anxiety    B12 deficiency    Depression    Esophageal reflux    Esophageal stricture    Fatty liver    GERD (gastroesophageal reflux disease)    Hypertension    IBS (irritable bowel syndrome)    Insomnia    Meniere disease, bilateral    sees Dr. Franky Huger with [facility]   Neck pain    Routine gynecological examination    sees Dr. Charlie Aho      Past Surgical History:  Procedure Laterality Date   APPENDECTOMY     COLONOSCOPY  06/28/2019   per Dr. Leigh, benign  polyps, repeat in 10 yrs    ESOPHAGOGASTRODUODENOSCOPY  06/28/2019   per Dr. Leigh, benign gastric polyp, source of GI blood loss    TONSILLECTOMY     TUBAL LIGATION     Family History  Problem Relation Age of Onset   Coronary artery disease Mother    Diabetes Mother    Irritable bowel syndrome Mother    Heart failure Mother    Healthy Father    Colon cancer Neg Hx    Esophageal cancer Neg Hx    Rectal cancer Neg Hx    Stomach cancer Neg Hx    Social History   Tobacco Use   Smoking status:  Never   Smokeless tobacco: Never  Vaping Use   Vaping status: Never Used  Substance Use Topics   Alcohol use: Yes    Alcohol/week: 2.0 standard drinks of alcohol    Types: 2 Standard drinks or equivalent per week   Drug use: No   Current Outpatient Medications  Medication Sig Dispense Refill   alendronate  (FOSAMAX ) 10 MG tablet Take 1 tablet (10 mg total) by mouth daily before breakfast. Take with a full glass of water on an empty stomach. 90 tablet 3   ALPRAZolam  (XANAX ) 0.5 MG tablet Take 1 tablet (0.5 mg total) by mouth at bedtime as needed for sleep. 90 tablet 1   aspirin 81 MG EC tablet Take 81 mg by mouth daily.     colestipol (COLESTID) 1 g tablet Take 1 tablet by mouth once to twice a day as needed 60 tablet 1   D-Mannose 500 MG CAPS Take 500 mg by mouth daily.     escitalopram  (LEXAPRO ) 20 MG tablet Take 1 tablet (20 mg total) by mouth daily. 90 tablet 3   esomeprazole  (NEXIUM ) 20 MG capsule Take 1 capsule (20 mg total) by mouth daily at 12 noon. 90 capsule 3   estradiol (ESTRACE) 0.1 MG/GM vaginal cream SMARTSIG:Gram(s) Vaginal Daily     ketoconazole (NIZORAL) 2 % cream      meclizine  (ANTIVERT ) 25 MG tablet Take 1 tablet (25 mg total) by mouth every 4 (four) hours as needed for dizziness. 120 tablet 3   meloxicam  (MOBIC ) 7.5 MG tablet Take 1 tablet (7.5 mg total) by mouth daily.     olmesartan  (BENICAR ) 20 MG tablet Take 1 tablet (20 mg total) by mouth daily. 90 tablet 3    tobramycin  (TOBREX ) 0.3 % ophthalmic solution Place 2 drops into both eyes every 4 (four) hours. 5 mL 0   valACYclovir (VALTREX) 500 MG tablet Take 1,000 mg by mouth 2 (two) times daily.     vitamin B-12 (CYANOCOBALAMIN ) 250 MCG tablet Take 250 mcg by mouth daily.     No current facility-administered medications for this visit.   Allergies  Allergen Reactions   Penicillins Rash    rash     Review of Systems: All systems reviewed and negative except where noted in HPI.   Lab Results  Component Value Date   WBC 6.3 07/16/2024   HGB 13.7 07/16/2024   HCT 40.4 07/16/2024   MCV 87.0 07/16/2024   PLT 230.0 07/16/2024    Lab Results  Component Value Date   IRON 103 07/16/2024   TIBC 361.2 07/16/2024   FERRITIN 23.3 07/16/2024     Physical Exam: BP 136/74   Pulse 74   Ht 5' 7 (1.702 m)   Wt 176 lb 6 oz (80 kg)   SpO2 97%   BMI 27.62 kg/m  Constitutional: Pleasant,well-developed, female in no acute distress. Neurological: Alert and oriented to person place and time. Psychiatric: Normal mood and affect. Behavior is normal.   ASSESSMENT: 73 y.o. female here for assessment of the following  1. Altered bowel habits   2. Bloating   3. History of colon polyps   4. Gastroesophageal reflux disease, unspecified whether esophagitis present   5. Gastric intestinal metaplasia    She has had some altered bowel habits over the past year.  CT scan in January showed some colonic inflammation and ileitis, follow-up colonoscopy in June showed no inflammatory changes anywhere.  Suspect she may have had an infectious colitis back in January now with  some postinfectious IBS.  Her symptoms in general appear to be slowly improving over time but still bother her periodically, namely appears to be postprandial with specific trigger foods.  Discussed options.  Gave her counseling on the low FODMAP diet and handouts provided to see if there is any clear trigger foods that are causing this and  avoid any high risk foods to cause diarrhea and bloating.  Will also give her some samples of Fodzyme to see if that helps as well.  Otherwise this mainly bothers her when she is eating out of the house with trigger foods.  I will give her some Colestid to use as needed, 1 g once to twice daily.  Counseled the risk of constipation with this but I think I would certainly use it prior to eating out of the house and could try daily use as a preventative if she wants.  If this is causing constipation she can titrate down.  She could also try taking Imodium prior to eating out in the restaurant.  Given her gas and bloating I will also screen her for celiac disease with labs, however remote EGD tested negative for this.  Discussed her history of reflux, using low-dose Nexium  as needed and infrequent use.  She will continue this as needed.  Discussed history of GIM on focal biopsy and theoretical risk for gastric cancer could be increased.  She has no family history of gastric cancer, discussed potential for surveillance with gastric mapping, she declines at this time  PLAN: - lab today - celiac labs - low FODMAP diet recommended, handouts given - trial of FODZYME - samples given - start colestid 1gm once to twice daily PRN - counseled on use - can use immodium PRN if needed - continue PPI PRN, counseled on risks, she is only using low dose - counseled on GIM, no risk factors for gastric cancer, holding off on surveillance EGD - f/u one year but contact me or f/u sooner if no improvement in bowels despite change in regimen  I spent 30 minutes of time, including in depth chart review, face-to-face time with the patient,  and documentation.   Marcey Naval, MD [facility]

## 2024-08-24 LAB — TISSUE TRANSGLUTAMINASE, IGA: (tTG) Ab, IgA: <1 U/mL

## 2024-08-24 LAB — IGA: Immunoglobulin A: 147 mg/dL (ref 70–320)

## 2024-08-25 ENCOUNTER — Ambulatory Visit: Payer: Self-pay | Admitting: Gastroenterology

## 2024-09-10 ENCOUNTER — Ambulatory Visit: Admitting: Family Medicine

## 2024-09-10 VITALS — BP 126/80 | HR 91 | Temp 97.8°F | Wt 173.0 lb

## 2024-09-10 DIAGNOSIS — M25512 Pain in left shoulder: Secondary | ICD-10-CM | POA: Diagnosis not present

## 2024-09-10 MED ORDER — METHYLPREDNISOLONE 4 MG PO TBPK: ORAL_TABLET | ORAL | 0 refills | Status: AC

## 2024-09-10 NOTE — Progress Notes (Signed)
   Subjective:    Patient ID: Pamela Daugherty, female    DOB: 30-Sep-1951, 73 y.o.   MRN: [id number]  HPI Here for 10 days of pain in the left shoulder. No recent trauma. She has applied ice and taken ES Tylenol with minimal relief.    Review of Systems  Constitutional: Negative.   Respiratory: Negative.    Cardiovascular: Negative.   Musculoskeletal:  Positive for arthralgias.       Objective:   Physical Exam Constitutional:      Appearance: Normal appearance.  Cardiovascular:     Rate and Rhythm: Normal rate and regular rhythm.     Pulses: Normal pulses.     Heart sounds: Normal heart sounds.  Pulmonary:     Effort: Pulmonary effort is normal.     Breath sounds: Normal breath sounds.  Musculoskeletal:     Comments: She is tender in the posterior left shoulder. No crepitus. Internal/external rotation and extension are limited by pain  Neurological:     Mental Status: She is alert.           Assessment & Plan:  Left shoulder pain, likely due to rotator cuff tendonitis. She will rest it and apply ice. Take a Medrol  dose pack. Follow up as needed.  Garnette Olmsted, MD

## 2024-10-01 DIAGNOSIS — Z1231 Encounter for screening mammogram for malignant neoplasm of breast: Secondary | ICD-10-CM | POA: Diagnosis not present

## 2024-10-01 LAB — HM MAMMOGRAPHY

## 2024-10-03 ENCOUNTER — Encounter: Payer: Self-pay | Admitting: Family Medicine

## 2024-10-21 ENCOUNTER — Ambulatory Visit

## 2024-10-21 ENCOUNTER — Encounter: Payer: Self-pay | Admitting: Family Medicine

## 2024-10-21 DIAGNOSIS — G8929 Other chronic pain: Secondary | ICD-10-CM

## 2024-10-23 NOTE — Telephone Encounter (Signed)
 I did the referral

## 2024-12-11 ENCOUNTER — Ambulatory Visit
